# Patient Record
Sex: Female | Born: 1965 | Race: White | Hispanic: No | Marital: Married | Smoking: Never smoker
Health system: Southern US, Community
[De-identification: ages and names within clinical notes are randomized; demographics above are authoritative.]

## PROBLEM LIST (undated history)

## (undated) DIAGNOSIS — Z9189 Other specified personal risk factors, not elsewhere classified: Secondary | ICD-10-CM

## (undated) DIAGNOSIS — K219 Gastro-esophageal reflux disease without esophagitis: Secondary | ICD-10-CM

## (undated) DIAGNOSIS — T7840XA Allergy, unspecified, initial encounter: Secondary | ICD-10-CM

## (undated) DIAGNOSIS — F419 Anxiety disorder, unspecified: Secondary | ICD-10-CM

## (undated) DIAGNOSIS — L80 Vitiligo: Secondary | ICD-10-CM

## (undated) DIAGNOSIS — Z8619 Personal history of other infectious and parasitic diseases: Secondary | ICD-10-CM

## (undated) DIAGNOSIS — R011 Cardiac murmur, unspecified: Secondary | ICD-10-CM

## (undated) DIAGNOSIS — Z87440 Personal history of urinary (tract) infections: Secondary | ICD-10-CM

## (undated) HISTORY — PX: WISDOM TOOTH EXTRACTION: SHX21

## (undated) HISTORY — DX: Gastro-esophageal reflux disease without esophagitis: K21.9

## (undated) HISTORY — DX: Personal history of other infectious and parasitic diseases: Z86.19

## (undated) HISTORY — DX: Allergy, unspecified, initial encounter: T78.40XA

## (undated) HISTORY — DX: Personal history of urinary (tract) infections: Z87.440

## (undated) HISTORY — DX: Cardiac murmur, unspecified: R01.1

## (undated) HISTORY — DX: Other specified personal risk factors, not elsewhere classified: Z91.89

## (undated) HISTORY — DX: Anxiety disorder, unspecified: F41.9

## (undated) HISTORY — DX: Vitiligo: L80

---

## 1999-11-09 HISTORY — PX: TOTAL VAGINAL HYSTERECTOMY: SHX2548

## 1999-11-09 HISTORY — PX: ABDOMINAL HYSTERECTOMY: SHX81

## 2013-12-24 ENCOUNTER — Other Ambulatory Visit: Payer: Self-pay

## 2013-12-24 DIAGNOSIS — Z1231 Encounter for screening mammogram for malignant neoplasm of breast: Secondary | ICD-10-CM

## 2014-01-04 ENCOUNTER — Ambulatory Visit
Admission: RE | Admit: 2014-01-04 | Discharge: 2014-01-04 | Disposition: A | Discharge status: Home / Self Care | Payer: BC Managed Care – PPO | Source: Ambulatory Visit

## 2014-01-04 DIAGNOSIS — Z1231 Encounter for screening mammogram for malignant neoplasm of breast: Secondary | ICD-10-CM

## 2015-03-24 ENCOUNTER — Other Ambulatory Visit: Payer: Self-pay

## 2015-03-24 DIAGNOSIS — Z1231 Encounter for screening mammogram for malignant neoplasm of breast: Secondary | ICD-10-CM

## 2015-04-01 ENCOUNTER — Ambulatory Visit
Admission: RE | Admit: 2015-04-01 | Discharge: 2015-04-01 | Disposition: A | Discharge status: Home / Self Care | Payer: BC Managed Care – PPO | Source: Ambulatory Visit

## 2015-04-01 DIAGNOSIS — Z1231 Encounter for screening mammogram for malignant neoplasm of breast: Secondary | ICD-10-CM

## 2015-04-03 ENCOUNTER — Other Ambulatory Visit (HOSPITAL_COMMUNITY)
Admission: RE | Admit: 2015-04-03 | Discharge: 2015-04-03 | Disposition: A | Discharge status: Home / Self Care | Payer: BC Managed Care – PPO | Source: Ambulatory Visit | Attending: Internal Medicine | Admitting: Internal Medicine

## 2015-04-03 ENCOUNTER — Other Ambulatory Visit: Payer: Self-pay | Admitting: Internal Medicine

## 2015-04-03 DIAGNOSIS — Z01419 Encounter for gynecological examination (general) (routine) without abnormal findings: Secondary | ICD-10-CM | POA: Insufficient documentation

## 2015-04-03 DIAGNOSIS — Z1151 Encounter for screening for human papillomavirus (HPV): Secondary | ICD-10-CM | POA: Diagnosis present

## 2015-04-08 LAB — CYTOLOGY - PAP

## 2016-04-07 ENCOUNTER — Other Ambulatory Visit: Payer: Self-pay

## 2016-04-07 DIAGNOSIS — Z1231 Encounter for screening mammogram for malignant neoplasm of breast: Secondary | ICD-10-CM

## 2016-04-22 ENCOUNTER — Ambulatory Visit
Admission: RE | Admit: 2016-04-22 | Discharge: 2016-04-22 | Disposition: A | Discharge status: Home / Self Care | Payer: BC Managed Care – PPO | Source: Ambulatory Visit

## 2016-04-22 DIAGNOSIS — Z1231 Encounter for screening mammogram for malignant neoplasm of breast: Secondary | ICD-10-CM

## 2017-06-10 ENCOUNTER — Other Ambulatory Visit: Payer: Self-pay | Admitting: Family Medicine

## 2017-06-10 DIAGNOSIS — Z1231 Encounter for screening mammogram for malignant neoplasm of breast: Secondary | ICD-10-CM

## 2017-06-30 ENCOUNTER — Ambulatory Visit
Admission: RE | Admit: 2017-06-30 | Discharge: 2017-06-30 | Disposition: A | Discharge status: Home / Self Care | Payer: BC Managed Care – PPO | Source: Ambulatory Visit | Attending: Family Medicine | Admitting: Family Medicine

## 2017-06-30 DIAGNOSIS — Z1231 Encounter for screening mammogram for malignant neoplasm of breast: Secondary | ICD-10-CM

## 2017-07-01 ENCOUNTER — Other Ambulatory Visit: Payer: Self-pay | Admitting: Family Medicine

## 2017-07-25 ENCOUNTER — Encounter: Payer: Self-pay | Admitting: Family Medicine

## 2017-07-25 ENCOUNTER — Ambulatory Visit (INDEPENDENT_AMBULATORY_CARE_PROVIDER_SITE_OTHER): Payer: BC Managed Care – PPO | Admitting: Family Medicine

## 2017-07-25 VITALS — BP 100/60 | HR 80 | Ht 68.0 in | Wt 143.0 lb

## 2017-07-25 DIAGNOSIS — Z23 Encounter for immunization: Secondary | ICD-10-CM

## 2017-07-25 DIAGNOSIS — Z7689 Persons encountering health services in other specified circumstances: Secondary | ICD-10-CM | POA: Diagnosis not present

## 2017-07-25 DIAGNOSIS — L80 Vitiligo: Secondary | ICD-10-CM | POA: Diagnosis not present

## 2017-07-25 NOTE — Progress Notes (Signed)
Name: Natasha Howell   MRN: [id number]    DOB: 11-18-1965   Date:07/25/2017       Progress Note  Subjective  Chief Complaint  Chief Complaint  Patient presents with  . Establish Care    new to area- get established and sched PE  . Vitiligo    needs referral to derm here    Patient to establish care    No problem-specific Assessment & Plan notes found for this encounter.   Past Medical History:  Diagnosis Date  . Vitiligo     Past Surgical History:  Procedure Laterality Date  . ABDOMINAL HYSTERECTOMY      Family History  Problem Relation Age of Onset  . Hypertension Mother   . Diabetes Sister     Social History   Social History  . Marital status: Married    Spouse name: N/A  . Number of children: N/A  . Years of education: N/A   Occupational History  . Not on file.   Social History Main Topics  . Smoking status: Never Smoker  . Smokeless tobacco: Never Used  . Alcohol use Yes  . Drug use: No  . Sexual activity: Yes   Other Topics Concern  . Not on file   Social History Narrative  . No narrative on file    Allergies  Allergen Reactions  . Morphine And Related     No outpatient prescriptions prior to visit.   No facility-administered medications prior to visit.     Review of Systems  Constitutional: Negative for chills, fever, malaise/fatigue and weight loss.  HENT: Negative for congestion, ear discharge, ear pain, nosebleeds, sore throat and tinnitus.   Eyes: Negative for blurred vision, double vision, photophobia, pain, discharge and redness.  Respiratory: Negative for cough, sputum production, shortness of breath and wheezing.   Cardiovascular: Negative for chest pain, palpitations, orthopnea, claudication, leg swelling and PND.  Gastrointestinal: Positive for heartburn. Negative for abdominal pain, blood in stool, constipation, diarrhea, melena, nausea and vomiting.  Genitourinary: Negative for dysuria, frequency, hematuria and urgency.   Musculoskeletal: Positive for back pain. Negative for joint pain, myalgias and neck pain.  Skin: Negative for rash.       Hx of vitiligo  Neurological: Positive for dizziness. Negative for tingling, tremors, sensory change, speech change, focal weakness, seizures, loss of consciousness and headaches.  Endo/Heme/Allergies: Negative for environmental allergies and polydipsia. Does not bruise/bleed easily.  Psychiatric/Behavioral: Negative for depression and suicidal ideas. The patient is not nervous/anxious and does not have insomnia.      Objective  Vitals:   07/25/17 1433  BP: 100/60  Pulse: 80  Weight: 143 lb (64.9 kg)  Height:  (1.727 m)    Physical Exam  Constitutional: She is well-developed, well-nourished, and in no distress. No distress.  HENT:  Head: Normocephalic and atraumatic.  Right Ear: External ear normal.  Left Ear: External ear normal.  Nose: Nose normal.  Mouth/Throat: Oropharynx is clear and moist.  Eyes: Pupils are equal, round, and reactive to light. Conjunctivae and EOM are normal. Right eye exhibits no discharge. Left eye exhibits no discharge.  Neck: Normal range of motion. Neck supple. No JVD present. No thyromegaly present.  Cardiovascular: Normal rate, regular rhythm, normal heart sounds and intact distal pulses.  Exam reveals no gallop and no friction rub.   No murmur heard. Pulmonary/Chest: Effort normal and breath sounds normal. She has no wheezes. She has no rales.  Abdominal: Soft. Bowel sounds are normal. She exhibits  no mass. There is no tenderness. There is no guarding.  Musculoskeletal: Normal range of motion. She exhibits no edema.  Lymphadenopathy:    She has no cervical adenopathy.  Neurological: She is alert. She has normal reflexes.  Skin: Skin is warm, dry and intact. She is not diaphoretic.  Psychiatric: Mood and affect normal.  Nursing note and vitals reviewed.     Assessment & Plan  Problem List Items Addressed This Visit     None    Visit Diagnoses    Establishing care with new doctor, encounter for    -  Primary   Vitiligo       Relevant Orders   Ambulatory referral to Dermatology   Influenza vaccine needed       Relevant Orders   Flu Vaccine QUAD 36+ mos IM (Completed)      No orders of the defined types were placed in this encounter.     Dr. Hayden [facility]  07/25/17

## 2017-08-25 ENCOUNTER — Encounter: Payer: Self-pay | Admitting: Family Medicine

## 2017-08-25 ENCOUNTER — Ambulatory Visit (INDEPENDENT_AMBULATORY_CARE_PROVIDER_SITE_OTHER): Payer: BC Managed Care – PPO | Admitting: Family Medicine

## 2017-08-25 VITALS — BP 120/80 | HR 68 | Ht 68.0 in | Wt 142.0 lb

## 2017-08-25 DIAGNOSIS — Z1211 Encounter for screening for malignant neoplasm of colon: Secondary | ICD-10-CM | POA: Diagnosis not present

## 2017-08-25 DIAGNOSIS — Z0000 Encounter for general adult medical examination without abnormal findings: Secondary | ICD-10-CM | POA: Diagnosis not present

## 2017-08-25 LAB — HEMOCCULT GUIAC POC 1CARD (OFFICE): Fecal Occult Blood, POC: NEGATIVE

## 2017-08-25 NOTE — Progress Notes (Signed)
Name: Prescilla Monger   MRN: [id number]    DOB: 1966-01-08   Date:08/25/2017       Progress Note  Subjective  Chief Complaint  Chief Complaint  Patient presents with  . Annual Exam    no pap needed, mammo up to date, immun utd, needs rectal exam for colonoscopy    Patient presents for annual physical exam.    No problem-specific Assessment & Plan notes found for this encounter.   Past Medical History:  Diagnosis Date  . Vitiligo     Past Surgical History:  Procedure Laterality Date  . ABDOMINAL HYSTERECTOMY      Family History  Problem Relation Age of Onset  . Hypertension Mother   . Diabetes Sister     Social History   Social History  . Marital status: Married    Spouse name: N/A  . Number of children: N/A  . Years of education: N/A   Occupational History  . Not on file.   Social History Main Topics  . Smoking status: Never Smoker  . Smokeless tobacco: Never Used  . Alcohol use Yes  . Drug use: No  . Sexual activity: Yes   Other Topics Concern  . Not on file   Social History Narrative  . No narrative on file    Allergies  Allergen Reactions  . Morphine And Related     No outpatient prescriptions prior to visit.   No facility-administered medications prior to visit.     Review of Systems  Constitutional: Positive for diaphoresis. Negative for chills, fever, malaise/fatigue and weight loss.  HENT: Negative for congestion, ear discharge, ear pain, hearing loss, nosebleeds, sinus pain, sore throat and tinnitus.   Eyes: Negative for blurred vision, double vision, photophobia, pain, discharge and redness.  Respiratory: Negative for cough, hemoptysis, sputum production, shortness of breath, wheezing and stridor.   Cardiovascular: Negative for chest pain, palpitations, orthopnea, claudication, leg swelling and PND.  Gastrointestinal: Positive for diarrhea, nausea and vomiting. Negative for abdominal pain, blood in stool, constipation, heartburn and  melena.  Genitourinary: Negative for dysuria, flank pain, frequency, hematuria and urgency.  Musculoskeletal: Negative for back pain, falls, joint pain, myalgias and neck pain.  Skin: Negative for itching and rash.  Neurological: Negative for dizziness, tingling, tremors, sensory change, speech change, focal weakness, seizures, loss of consciousness, weakness and headaches.  Endo/Heme/Allergies: Negative for environmental allergies and polydipsia. Does not bruise/bleed easily.  Psychiatric/Behavioral: Negative for depression, hallucinations, memory loss, substance abuse and suicidal ideas. The patient is not nervous/anxious and does not have insomnia.      Objective  Vitals:   08/25/17 0836  BP: 120/80  Pulse: 68  Weight: 142 lb (64.4 kg)  Height: 5\' 8"  (1.727 m)    Physical Exam  Constitutional: She is oriented to person, place, and time and well-developed, well-nourished, and in no distress. No distress.  HENT:  Head: Normocephalic and atraumatic.  Right Ear: External ear and ear canal normal. Tympanic membrane is retracted.  Left Ear: External ear and ear canal normal. Tympanic membrane is retracted.  Nose: Nose normal.  Mouth/Throat: Uvula is midline, oropharynx is clear and moist and mucous membranes are normal.  Eyes: Pupils are equal, round, and reactive to light. Conjunctivae, EOM and lids are normal. Right eye exhibits no discharge. Left eye exhibits no discharge.  Fundoscopic exam:      The right eye shows no arteriolar narrowing.       The left eye shows no arteriolar narrowing.  Neck:  Trachea normal, normal range of motion and full passive range of motion without pain. Neck supple. Normal carotid pulses, no hepatojugular reflux and no JVD present. Carotid bruit is not present. No thyromegaly present.  Cardiovascular: Normal rate, regular rhythm, S1 normal, S2 normal, intact distal pulses and normal pulses.   No extrasystoles are present. PMI is not displaced.  Exam  reveals no gallop, no S3, no S4 and no friction rub.   No murmur heard. Pulmonary/Chest: Effort normal and breath sounds normal. She has no wheezes. She has no rales. She exhibits no mass.  Abdominal: Soft. Normal aorta and bowel sounds are normal. She exhibits no mass. There is no hepatosplenomegaly. There is no tenderness. There is no rebound, no guarding and no CVA tenderness.  Genitourinary: Rectum normal. Rectal exam shows no external hemorrhoid.  Musculoskeletal: Normal range of motion. She exhibits no edema.  Lymphadenopathy:       Head (right side): No submandibular adenopathy present.       Head (left side): No submandibular adenopathy present.    She has no cervical adenopathy.    She has no axillary adenopathy.  Neurological: She is alert and oriented to person, place, and time. She has normal sensation, normal strength, normal reflexes and intact cranial nerves.  Skin: Skin is warm, dry and intact. She is not diaphoretic.  vitiligo  Psychiatric: Mood and affect normal.  Nursing note and vitals reviewed.     Assessment & Plan  Problem List Items Addressed This Visit    None    Visit Diagnoses    Annual physical exam    -  Primary   Relevant Orders   Lipid Profile   Renal Function Panel   Colon cancer screening       Relevant Orders   POCT occult blood stool (Completed)   Ambulatory referral to Gastroenterology      No orders of the defined types were placed in this encounter.     Dr. Hayden Rasmusseneanna Christop Hipp[facility]  08/25/17

## 2017-08-25 NOTE — Patient Instructions (Signed)
Black Cohosh, Cimicifuga racemosa oral dosage forms What is this medicine? BLACK COHOSH (blak KOH hosh) or Cimicifuga racemosa is a dietary supplement. It is promoted to relieve symptoms of menopause, such as hot flashes. The FDA has not approved this supplement for any medical use. This supplement may be used for other purposes; ask your health care provider or pharmacist if you have questions. This medicine may be used for other purposes; ask your health care provider or pharmacist if you have questions. What should I tell my health care provider before I take this medicine? They need to know if you have any of these conditions: -breast cancer -cervical, ovarian or uterine cancer -high blood pressure -infertility -liver disease -menstrual changes or irregular periods -unusual vaginal or uterine bleeding -an unusual or allergic reaction to black cohosh, soybeans, tartrazine dye (yellow dye number 5), other medicines, foods, dyes, or preservatives -pregnant or trying to get pregnant -breast-feeding How should I use this medicine? Take this herb by mouth with a glass of water. Follow the directions on the package labeling, or talk to your health care professional. Do not use for longer than 6 months without the advice of a health care professional. Do not use if you are pregnant or breast-feeding. Talk to your obstetrician-gynecologist or certified nurse-midwife. This herb is not for use in children under the age of 18 years. Overdosage: If you think you have taken too much of this medicine contact a poison control center or emergency room at once. NOTE: This medicine is only for you. Do not share this medicine with others. What if I miss a dose? If you miss a dose, take it as soon as you can. If it is almost time for your next dose, take only that dose. Do not take double or extra doses. What may interact with this medicine? -atorvastatin -cisplatin -fertility treatments This list may not  describe all possible interactions. Give your health care provider a list of all the medicines, herbs, non-prescription drugs, or dietary supplements you use. Also tell them if you smoke, drink alcohol, or use illegal drugs. Some items may interact with your medicine. What should I watch for while using this medicine? Since this herb is derived from a plant, allergic reactions are possible. Stop using this herb if you develop a rash. You may need to see your health care professional, or inform them that this occurred. Report any unusual side effects promptly. If you are taking this herb for menstrual or menopausal symptoms, visit your doctor or health care professional for regular checks on your progress. You should have a complete check-up every 6 months. You will need a regular breast and pelvic exam while on this therapy. Follow the advice of your doctor or health care professional. Women should inform their doctor if they wish to become pregnant or think they might be pregnant. If you have any reason to think you are pregnant, stop taking this herb at once and contact your doctor or health care professional. Herbal or dietary supplements are not regulated like medicines. Rigid quality control standards are not required for dietary supplements. The purity and strength of these products can vary. The safety and effect of this dietary supplement for a certain disease or illness is not well known. This product is not intended to diagnose, treat, cure or prevent any disease. The Food and Drug Administration suggests the following to help consumers protect themselves: -Always read product labels and follow directions. -Natural does not mean a product is   safe for humans to take. -Look for products that include USP after the ingredient name. This means that the manufacturer followed the standards of the [place] Pharmacopoeia. -Supplements made or sold by a nationally known food or drug company are more likely to  be made under tight controls. You can write to the company for more information about how the product was made. What side effects may I notice from receiving this medicine? Side effects that you should report to your doctor or health care professional as soon as possible: -allergic reactions like skin rash, itching or hives, swelling of the face, lips, or tongue -breathing problems -dizziness -palpitations -signs and symptoms of liver injury like dark yellow or brown urine; general ill feeling or flu-like symptoms; light-colored stools; loss of appetite; nausea; right upper belly pain; unusually weak or tired; yellowing of the eyes or skin -unusual vaginal bleeding Side effects that usually do not require medical attention (report to your doctor or health care professional if they continue or are bothersome): -breast tenderness -headache -nausea -upset stomach This list may not describe all possible side effects. Call your doctor for medical advice about side effects. You may report side effects to FDA at 1-800-FDA-1088. Where should I keep my medicine? Keep out of the reach of children. Store at room temperature between 15 and 30 degrees C (59 and 86 degrees C). Throw away any unused herb after the expiration date. NOTE: This sheet is a summary. It may not cover all possible information. If you have questions about this medicine, talk to your doctor, pharmacist, or health care provider.  2018 Elsevier/Gold Standard (2016-05-05 14:35:09)  

## 2017-08-26 LAB — LIPID PANEL
Chol/HDL Ratio: 2.8 ratio (ref 0.0–4.4)
Cholesterol, Total: 146 mg/dL (ref 100–199)
HDL: 52 mg/dL (ref >39)
LDL Calculated: 81 mg/dL (ref 0–99)
TRIGLYCERIDES: 66 mg/dL (ref 0–149)
VLDL CHOLESTEROL CAL: 13 mg/dL (ref 5–40)

## 2017-08-26 LAB — RENAL FUNCTION PANEL
ALBUMIN: 4.3 g/dL (ref 3.5–5.5)
BUN/Creatinine Ratio: 20 (ref 9–23)
BUN: 11 mg/dL (ref 6–24)
CO2: 23 mmol/L (ref 20–29)
CREATININE: 0.55 mg/dL — AB (ref 0.57–1.00)
Calcium: 9.1 mg/dL (ref 8.7–10.2)
Chloride: 103 mmol/L (ref 96–106)
GFR, EST AFRICAN AMERICAN: 126 mL/min/{1.73_m2} (ref >59)
GFR, EST NON AFRICAN AMERICAN: 110 mL/min/{1.73_m2} (ref >59)
GLUCOSE: 73 mg/dL (ref 65–99)
POTASSIUM: 4.3 mmol/L (ref 3.5–5.2)
Phosphorus: 3.5 mg/dL (ref 2.5–4.5)
Sodium: 140 mmol/L (ref 134–144)

## 2017-10-26 ENCOUNTER — Ambulatory Visit: Payer: BC Managed Care – PPO | Admitting: Family Medicine

## 2017-10-26 ENCOUNTER — Encounter: Payer: Self-pay | Admitting: Family Medicine

## 2017-10-26 VITALS — BP 120/70 | HR 64 | Ht 68.0 in | Wt 141.0 lb

## 2017-10-26 DIAGNOSIS — E860 Dehydration: Secondary | ICD-10-CM | POA: Diagnosis not present

## 2017-10-26 DIAGNOSIS — R55 Syncope and collapse: Secondary | ICD-10-CM | POA: Diagnosis not present

## 2017-10-26 NOTE — Progress Notes (Signed)
Name: Natasha Howell   MRN: [id number]    DOB: 07-11-1966   Date:10/26/2017       Progress Note  Subjective  Chief Complaint  Chief Complaint  Patient presents with  . Follow-up    in the ER on 10/21/17 after having chest "discomfort" and nausea/ vomitting. troponin levels were neg x 2- thinks that she is having sugar problems/ sister is type 1 as well as other members of family    Dizziness  This is a new problem. The current episode started in the past 7 days (near syncopy). The problem occurs rarely. Associated symptoms include diaphoresis and nausea. Pertinent negatives include no abdominal pain, anorexia, arthralgias, change in bowel habit, chest pain, chills, congestion, coughing, fatigue, fever, headaches, joint swelling, myalgias, neck pain, numbness, rash, sore throat, swollen glands, urinary symptoms, vertigo, visual change, vomiting or weakness. Nothing aggravates the symptoms. She has tried drinking (iv fluids) for the symptoms. The treatment provided mild relief.    No problem-specific Assessment & Plan notes found for this encounter.   Past Medical History:  Diagnosis Date  . Vitiligo     Past Surgical History:  Procedure Laterality Date  . ABDOMINAL HYSTERECTOMY      Family History  Problem Relation Age of Onset  . Hypertension Mother   . Diabetes Sister     Social History   Socioeconomic History  . Marital status: Married    Spouse name: Not on file  . Number of children: Not on file  . Years of education: Not on file  . Highest education level: Not on file  Social Needs  . Financial resource strain: Not on file  . Food insecurity - worry: Not on file  . Food insecurity - inability: Not on file  . Transportation needs - medical: Not on file  . Transportation needs - non-medical: Not on file  Occupational History  . Not on file  Tobacco Use  . Smoking status: Never Smoker  . Smokeless tobacco: Never Used  Substance and Sexual Activity  . Alcohol  use: Yes  . Drug use: No  . Sexual activity: Yes  Other Topics Concern  . Not on file  Social History Narrative  . Not on file    Allergies  Allergen Reactions  . Morphine And Related     No outpatient medications prior to visit.   No facility-administered medications prior to visit.     Review of Systems  Constitutional: Positive for diaphoresis. Negative for chills, fatigue, fever, malaise/fatigue and weight loss.  HENT: Negative for congestion, ear discharge, ear pain and sore throat.   Eyes: Negative for blurred vision.  Respiratory: Negative for cough, sputum production, shortness of breath and wheezing.   Cardiovascular: Negative for chest pain, palpitations and leg swelling.  Gastrointestinal: Positive for nausea. Negative for abdominal pain, anorexia, blood in stool, change in bowel habit, constipation, diarrhea, heartburn, melena and vomiting.  Genitourinary: Negative for dysuria, frequency, hematuria and urgency.  Musculoskeletal: Negative for arthralgias, back pain, joint pain, joint swelling, myalgias and neck pain.  Skin: Negative for rash.  Neurological: Positive for dizziness. Negative for vertigo, tingling, sensory change, focal weakness, weakness, numbness and headaches.  Endo/Heme/Allergies: Negative for environmental allergies and polydipsia. Does not bruise/bleed easily.  Psychiatric/Behavioral: Negative for depression and suicidal ideas. The patient is not nervous/anxious and does not have insomnia.      Objective  Vitals:   10/26/17 1136  BP: 120/70  Pulse: 64  Weight: 141 lb (64 kg)  Height:  5\' 8"  (1.727 m)    Physical Exam  Constitutional: She is well-developed, well-nourished, and in no distress. No distress.  HENT:  Head: Normocephalic and atraumatic.  Right Ear: External ear normal.  Left Ear: External ear normal.  Nose: Nose normal.  Mouth/Throat: Oropharynx is clear and moist.  Eyes: Conjunctivae and EOM are normal. Pupils are equal,  round, and reactive to light. Right eye exhibits no discharge. Left eye exhibits no discharge.  Neck: Normal range of motion. Neck supple. No JVD present. No thyromegaly present.  Cardiovascular: Normal rate, regular rhythm, normal heart sounds and intact distal pulses. Exam reveals no gallop and no friction rub.  No murmur heard. Pulmonary/Chest: Effort normal and breath sounds normal. She has no wheezes. She has no rales.  Abdominal: Soft. Bowel sounds are normal. She exhibits no mass. There is no tenderness. There is no guarding.  Musculoskeletal: Normal range of motion. She exhibits no edema.  Lymphadenopathy:    She has no cervical adenopathy.  Neurological: She is alert. She has normal reflexes.  Skin: Skin is warm and dry. She is not diaphoretic.  Psychiatric: Mood and affect normal.  Nursing note and vitals reviewed.     Assessment & Plan  Problem List Items Addressed This Visit    None    Visit Diagnoses    Vasovagal near syncope    -  Primary   Dehydration          No orders of the defined types were placed in this encounter.     Dr. Hayden Rasmusseneanna Dorie [facility]ms [facility]  10/26/17

## 2017-10-26 NOTE — Patient Instructions (Signed)
Vasovagal Syncope, Adult  Syncope, which is commonly known as fainting or passing out, is a temporary loss of consciousness. It occurs when the blood flow to the brain is reduced. Vasovagal syncope, also called neurocardiogenic syncope, is a fainting spell that happens when blood flow to the brain is reduced because of a sudden drop in heart rate and blood pressure.  Vasovagal syncope is usually harmless. However, you can get injured if you fall during a fainting spell.  What are the causes?  This condition is caused by a drop in heart rate and blood pressure, usually in response to a trigger. Many things and situations can trigger an episode, including:  · Pain.  · Fear.  · The sight of blood. This may occur during medical procedures, such as when blood is being drawn from a vein.  · Common activities, such as coughing, swallowing, stretching, or going to the bathroom.  · Emotional stress.  · Being in a confined space.  · Prolonged standing, especially in a warm environment.  · Lack of sleep or rest.  · Not eating for a long time.  · Not drinking enough liquids.  · Recent illness.  · Drinking alcohol.  · Taking drugs that affect blood pressure, such as marijuana, cocaine, opiates, or inhalants.    What are the signs or symptoms?  Before a fainting episode, you may:  · Feel dizzy or light-headed.  · Become pale.  · Sense that you are going to faint.  · Feel like the room is spinning.  · Only see directly ahead (tunnel vision).  · Feel sick to your stomach (nauseous).  · See spots.  · Slowly lose vision.  · Hear ringing in your ears.  · Have a headache.  · Feel warm and sweaty.  · Feel a sensation of pins and needles.    During the fainting spell, you may twitch or make jerky movements. Fainting spells usually last no longer than a few minutes before you wake up. If you get up too quickly before your body can recover, you may faint again.  How is this diagnosed?  This condition is diagnosed based on your symptoms,  your medical history, and a physical exam. Tests may be done to rule out other causes of fainting. Tests may include:  · Blood tests.  · Heart tests, such as an electrocardiogram (ECG), echocardiogram, or electrophysiology study.  · A test to check your response to changes in position (tilt table test).    How is this treated?  Usually, treatment is not needed for this condition. Your health care provider may suggest ways to help prevent fainting episodes. These may include:  · Drinking additional fluids if you are exposed to a trigger.  · Sitting or lying down if you notice signs that an episode is coming.    If your fainting spells continue, your health care provider may recommend that you:  · Take medicines to prevent fainting or to help reduce further episodes of fainting.  · Do certain exercises.  · Wear compression stockings.  · Have surgery to place a pacemaker in your body (rare).    Follow these instructions at home:  · Learn to identify the signs that an episode is coming.  · Sit or lie down at the first sign of a fainting spell. If you sit down, put your head down between your legs. If you lie down, swing your legs up in the air to increase blood flow to the brain.  ·   Avoid hot tubs and saunas.  · Avoid standing for a long time. If you have to stand for a long time, try:  ? Crossing your legs.  ? Flexing and stretching your leg muscles.  ? Squatting.  ? Moving your legs.  ? Bending over.  · Drink enough fluid to keep your urine clear or pale yellow.  · Make changes to your diet that your health care provider recommends. You may be told to:  ? Avoid caffeine.  ? Eat more salt.  · Take over-the-counter and prescription medicines only as told by your health care provider.  Contact a health care provider if:  · You continue to have fainting spells despite treatment.  · You faint more often despite treatment.  · You lose consciousness for more than a few minutes.  · You faint during or after exercising or  after being startled.  · You have twitching or jerky movements for longer than a few seconds during a fainting spell.  · You have an episode of twitching or jerky movements without fainting.  Get help right away if:  · A fainting spell leads to an injury or bleeding.  · You have new symptoms that occur with the fainting spells, such as:  ? Shortness of breath.  ? Chest pain.  ? Irregular heartbeat.  · You twitch or make jerky movements for more than 5 minutes.  · You twitch or make jerky movements during more than one fainting spell.  This information is not intended to replace advice given to you by your health care provider. Make sure you discuss any questions you have with your health care provider.  Document Released: 10/11/2012 Document Revised: 04/07/2016 Document Reviewed: 08/23/2015  Elsevier Interactive Patient Education © 2018 [employer]vier Inc[employer]

## 2017-11-09 ENCOUNTER — Telehealth: Payer: Self-pay | Admitting: Gastroenterology

## 2017-11-09 NOTE — Telephone Encounter (Signed)
Patient called back to schedule a procedure.

## 2017-11-09 NOTE — Telephone Encounter (Signed)
Patient called back to schedule colonoscopy.  ?

## 2017-11-10 ENCOUNTER — Other Ambulatory Visit: Payer: Self-pay

## 2017-11-10 DIAGNOSIS — Z1211 Encounter for screening for malignant neoplasm of colon: Secondary | ICD-10-CM

## 2017-11-10 NOTE — Progress Notes (Signed)
Gastroenterology Pre-Procedure Review  Request Date: 2/8 Requesting Physician: Dr. Servando SnareWohl  PATIENT REVIEW QUESTIONS: The patient responded to the following health history questions as indicated:    1. Are you having any GI issues? no 2. Do you have a personal history of Polyps? no 3. Do you have a family history of Colon Cancer or Polyps? no 4. Diabetes Mellitus? no 5. Joint replacements in the past 12 months?no 6. Major health problems in the past 3 months?no 7. Any artificial heart valves, MVP, or defibrillator?no    MEDICATIONS & ALLERGIES:    Patient reports the following regarding taking any anticoagulation/antiplatelet therapy:   Plavix, Coumadin, Eliquis, Xarelto, Lovenox, Pradaxa, Brilinta, or Effient? no Aspirin? no  Patient confirms/reports the following medications:  No current outpatient medications on file.   No current facility-administered medications for this visit.     Patient confirms/reports the following allergies:  Allergies  Allergen Reactions  . Morphine And Related     No orders of the defined types were placed in this encounter.   AUTHORIZATION INFORMATION Primary Insurance: 1D#: Group #:  Secondary Insurance: 1D#: Group #:  SCHEDULE INFORMATION: Date: 2/8 Time: Location: [facility]

## 2017-11-10 NOTE — Telephone Encounter (Signed)
Returned patient's call to schedule colonoscopy.

## 2017-11-21 ENCOUNTER — Ambulatory Visit: Payer: BC Managed Care – PPO | Admitting: Family Medicine

## 2017-11-21 ENCOUNTER — Encounter: Payer: Self-pay | Admitting: Family Medicine

## 2017-11-21 VITALS — BP 120/80 | HR 72 | Ht 68.0 in | Wt 142.0 lb

## 2017-11-21 DIAGNOSIS — F419 Anxiety disorder, unspecified: Secondary | ICD-10-CM | POA: Diagnosis not present

## 2017-11-21 DIAGNOSIS — R002 Palpitations: Secondary | ICD-10-CM

## 2017-11-21 NOTE — Progress Notes (Signed)
Name: Natasha Howell   MRN: [id number]    DOB: 1966/06/29   Date:11/21/2017       Progress Note  Subjective  Chief Complaint  Chief Complaint  Patient presents with  . change in bowel habits    no appetite, diarrhea off and on- sched for colonoscopy on 12/16/17  . Anxiety    notices that it starts prior to traveling and during traveling. Has vomitting and diarrhea. Has to travel with job    Anxiety  Presents for follow-up visit. Symptoms include dizziness, excessive worry, nervous/anxious behavior and palpitations. Patient reports no chest pain, compulsions, confusion, decreased concentration, depressed mood, dry mouth, feeling of choking, hyperventilation, impotence, insomnia, irritability, malaise, muscle tension, nausea, obsessions, panic, restlessness, shortness of breath or suicidal ideas. Symptoms occur most days. The severity of symptoms is moderate. The quality of sleep is good. Nighttime awakenings: occasional.   Side effects of treatment include GI discomfort.    No problem-specific Assessment & Plan notes found for this encounter.   Past Medical History:  Diagnosis Date  . Vitiligo     Past Surgical History:  Procedure Laterality Date  . ABDOMINAL HYSTERECTOMY      Family History  Problem Relation Age of Onset  . Hypertension Mother   . Diabetes Sister     Social History   Socioeconomic History  . Marital status: Married    Spouse name: Not on file  . Number of children: Not on file  . Years of education: Not on file  . Highest education level: Not on file  Social Needs  . Financial resource strain: Not on file  . Food insecurity - worry: Not on file  . Food insecurity - inability: Not on file  . Transportation needs - medical: Not on file  . Transportation needs - non-medical: Not on file  Occupational History  . Not on file  Tobacco Use  . Smoking status: Never Smoker  . Smokeless tobacco: Never Used  Substance and Sexual Activity  . Alcohol use: Yes   . Drug use: No  . Sexual activity: Yes  Other Topics Concern  . Not on file  Social History Narrative  . Not on file    Allergies  Allergen Reactions  . Morphine And Related     Outpatient Medications Prior to Visit  Medication Sig Dispense Refill  . omeprazole (PRILOSEC) 20 MG capsule Take 20 mg by mouth daily. otc    . Probiotic Product (ALIGN EXTRA STRENGTH PO) Take 1 capsule by mouth daily.     No facility-administered medications prior to visit.     Review of Systems  Constitutional: Negative for chills, fever, irritability, malaise/fatigue and weight loss.  HENT: Negative for ear discharge, ear pain and sore throat.   Eyes: Negative for blurred vision.  Respiratory: Negative for cough, sputum production, shortness of breath and wheezing.   Cardiovascular: Positive for palpitations. Negative for chest pain and leg swelling.  Gastrointestinal: Negative for abdominal pain, blood in stool, constipation, diarrhea, heartburn, melena and nausea.  Genitourinary: Negative for dysuria, frequency, hematuria, impotence and urgency.  Musculoskeletal: Negative for back pain, joint pain, myalgias and neck pain.  Skin: Negative for rash.  Neurological: Positive for dizziness. Negative for tingling, sensory change, focal weakness and headaches.  Endo/Heme/Allergies: Negative for environmental allergies and polydipsia. Does not bruise/bleed easily.  Psychiatric/Behavioral: Negative for confusion, decreased concentration, depression and suicidal ideas. The patient is nervous/anxious. The patient does not have insomnia.      Objective  Vitals:  11/21/17 1339  BP: 120/80  Pulse: 72  Weight: 142 lb (64.4 kg)  Height: 5\' 8"  (1.727 m)    Physical Exam  Constitutional: She is well-developed, well-nourished, and in no distress. No distress.  HENT:  Head: Normocephalic and atraumatic.  Right Ear: External ear normal.  Left Ear: External ear normal.  Nose: Nose normal.   Mouth/Throat: Oropharynx is clear and moist.  Eyes: Conjunctivae and EOM are normal. Pupils are equal, round, and reactive to light. Right eye exhibits no discharge. Left eye exhibits no discharge.  Neck: Normal range of motion. Neck supple. No JVD present. No thyromegaly present.  Cardiovascular: Normal rate, regular rhythm, normal heart sounds and intact distal pulses. Exam reveals no gallop and no friction rub.  No murmur heard. Pulmonary/Chest: Effort normal and breath sounds normal. She has no wheezes. She has no rales.  Abdominal: Soft. Bowel sounds are normal. She exhibits no mass. There is no tenderness. There is no guarding.  Musculoskeletal: Normal range of motion. She exhibits no edema.  Lymphadenopathy:    She has no cervical adenopathy.  Neurological: She is alert. She has normal reflexes.  Skin: Skin is warm and dry. She is not diaphoretic.  Psychiatric: Mood and affect normal.  Nursing note and vitals reviewed.     Assessment & Plan  Problem List Items Addressed This Visit    None    Visit Diagnoses    Palpitations    -  Primary   eval neaer syncopes/?Holter/echo   Relevant Orders   EKG 12-Lead (Completed)   CBC with Differential/Platelet   Renal function panel   TSH   Ambulatory referral to Cardiology   Anxiety          No orders of the defined types were placed in this encounter.     Dr. Hayden Rasmusseneanna Jones [facility]  11/21/17

## 2017-11-22 LAB — CBC WITH DIFFERENTIAL/PLATELET
BASOS: 0 %
Basophils Absolute: 0 10*3/uL (ref 0.0–0.2)
EOS (ABSOLUTE): 0.2 10*3/uL (ref 0.0–0.4)
EOS: 2 %
HEMATOCRIT: 43 % (ref 34.0–46.6)
HEMOGLOBIN: 14.1 g/dL (ref 11.1–15.9)
Immature Grans (Abs): 0 10*3/uL (ref 0.0–0.1)
Immature Granulocytes: 0 %
LYMPHS ABS: 1.9 10*3/uL (ref 0.7–3.1)
Lymphs: 23 %
MCH: 30 pg (ref 26.6–33.0)
MCHC: 32.8 g/dL (ref 31.5–35.7)
MCV: 92 fL (ref 79–97)
MONOS ABS: 0.6 10*3/uL (ref 0.1–0.9)
Monocytes: 7 %
NEUTROS ABS: 5.7 10*3/uL (ref 1.4–7.0)
Neutrophils: 68 %
Platelets: 239 10*3/uL (ref 150–379)
RBC: 4.7 x10E6/uL (ref 3.77–5.28)
RDW: 13.2 % (ref 12.3–15.4)
WBC: 8.3 10*3/uL (ref 3.4–10.8)

## 2017-11-22 LAB — RENAL FUNCTION PANEL
ALBUMIN: 4.8 g/dL (ref 3.5–5.5)
BUN / CREAT RATIO: 14 (ref 9–23)
BUN: 11 mg/dL (ref 6–24)
CO2: 23 mmol/L (ref 20–29)
CREATININE: 0.79 mg/dL (ref 0.57–1.00)
Calcium: 9.5 mg/dL (ref 8.7–10.2)
Chloride: 98 mmol/L (ref 96–106)
GFR, EST AFRICAN AMERICAN: 100 mL/min/{1.73_m2} (ref >59)
GFR, EST NON AFRICAN AMERICAN: 87 mL/min/{1.73_m2} (ref >59)
Glucose: 86 mg/dL (ref 65–99)
Phosphorus: 4.6 mg/dL — ABNORMAL HIGH (ref 2.5–4.5)
Potassium: 4.4 mmol/L (ref 3.5–5.2)
Sodium: 139 mmol/L (ref 134–144)

## 2017-11-22 LAB — TSH: TSH: 1.72 u[IU]/mL (ref 0.450–4.500)

## 2017-11-24 ENCOUNTER — Other Ambulatory Visit: Payer: Self-pay

## 2017-11-24 DIAGNOSIS — F419 Anxiety disorder, unspecified: Secondary | ICD-10-CM

## 2017-11-24 MED ORDER — BUSPIRONE HCL 15 MG PO TABS: 15.0000 mg | ORAL_TABLET | Freq: Every day | ORAL | 0 refills | Status: DC

## 2017-11-25 DIAGNOSIS — R002 Palpitations: Secondary | ICD-10-CM | POA: Insufficient documentation

## 2017-11-25 DIAGNOSIS — R55 Syncope and collapse: Secondary | ICD-10-CM | POA: Insufficient documentation

## 2017-11-30 ENCOUNTER — Other Ambulatory Visit: Payer: Self-pay

## 2017-11-30 DIAGNOSIS — F419 Anxiety disorder, unspecified: Secondary | ICD-10-CM

## 2017-11-30 MED ORDER — ALPRAZOLAM 0.25 MG PO TABS: 0.2500 mg | ORAL_TABLET | Freq: Every day | ORAL | 0 refills | Status: DC | PRN

## 2017-12-14 ENCOUNTER — Telehealth: Payer: Self-pay | Admitting: Gastroenterology

## 2017-12-14 NOTE — Telephone Encounter (Signed)
Patient called to cancel her procedure due to traveling the next 6 weeks with work. She will call us back to reschedule. I called [facility]

## 2017-12-23 ENCOUNTER — Ambulatory Visit: Admit: 2017-12-23 | Payer: BC Managed Care – PPO | Admitting: Gastroenterology

## 2017-12-23 SURGERY — COLONOSCOPY WITH PROPOFOL
Anesthesia: Choice

## 2018-01-20 ENCOUNTER — Ambulatory Visit: Payer: BC Managed Care – PPO | Admitting: Psychology

## 2018-01-20 DIAGNOSIS — F4322 Adjustment disorder with anxiety: Secondary | ICD-10-CM

## 2018-02-03 ENCOUNTER — Ambulatory Visit: Payer: BC Managed Care – PPO | Admitting: Psychology

## 2018-02-03 DIAGNOSIS — F4322 Adjustment disorder with anxiety: Secondary | ICD-10-CM

## 2018-02-17 ENCOUNTER — Ambulatory Visit: Payer: BC Managed Care – PPO | Admitting: Psychology

## 2018-02-17 DIAGNOSIS — F4322 Adjustment disorder with anxiety: Secondary | ICD-10-CM | POA: Diagnosis not present

## 2018-03-24 ENCOUNTER — Ambulatory Visit: Payer: BC Managed Care – PPO | Admitting: Psychology

## 2018-03-24 DIAGNOSIS — F4322 Adjustment disorder with anxiety: Secondary | ICD-10-CM | POA: Diagnosis not present

## 2018-05-01 ENCOUNTER — Ambulatory Visit
Admission: EM | Admit: 2018-05-01 | Discharge: 2018-05-01 | Disposition: A | Discharge status: Home / Self Care | Payer: BC Managed Care – PPO | Attending: Emergency Medicine | Admitting: Emergency Medicine

## 2018-05-01 ENCOUNTER — Encounter: Payer: Self-pay | Admitting: *Deleted

## 2018-05-01 DIAGNOSIS — R05 Cough: Secondary | ICD-10-CM | POA: Diagnosis not present

## 2018-05-01 DIAGNOSIS — R059 Cough, unspecified: Secondary | ICD-10-CM

## 2018-05-01 MED ORDER — BENZONATATE 200 MG PO CAPS: 200.0000 mg | ORAL_CAPSULE | Freq: Three times a day (TID) | ORAL | 0 refills | Status: DC | PRN

## 2018-05-01 MED ORDER — AZITHROMYCIN 250 MG PO TABS: 250.0000 mg | ORAL_TABLET | Freq: Every day | ORAL | 0 refills | Status: DC

## 2018-05-01 MED ORDER — HYDROCOD POLST-CPM POLST ER 10-8 MG/5ML PO SUER: 5.0000 mL | Freq: Two times a day (BID) | ORAL | 0 refills | Status: DC | PRN

## 2018-05-01 NOTE — ED Provider Notes (Signed)
HPI  SUBJECTIVE:  Natasha Howell is a 52 y.o. female who presents with a cough productive of greenish mucus for the past week.  States that she is getting worse.  States that she is unable to sleep at night secondary to the cough.  Reports chest soreness from the cough.  She states that she had postnasal drip, but this has resolved.  This started off as a sore throat.  No fevers, wheezing, shortness of breath, dyspnea on exertion.  No nasal congestion, rhinorrhea, sinus pain or pressure, dental pain.  She denies allergies or GERD symptoms.  She tried Robitussin, Mucinex and hot Toddy's.  The hot Toddy's help.  Symptoms are worse with Robitussin, talking.  No antibiotics in the past month.  No antipyretic in the past 6 to 8 hours.  She has a past medical history of GERD and states that it is not bothering her.  No history of lung disease, smoking, diabetes, hypertension.  PMD: Dr. Yetta BarreJones.  She states that she is in the process of looking for another provider.    Past Medical History:  Diagnosis Date  . Vitiligo     Past Surgical History:  Procedure Laterality Date  . ABDOMINAL HYSTERECTOMY      Family History  Problem Relation Age of Onset  . Hypertension Mother   . Diabetes Sister     Social History   Tobacco Use  . Smoking status: Never Smoker  . Smokeless tobacco: Never Used  Substance Use Topics  . Alcohol use: Yes  . Drug use: No    No current facility-administered medications for this encounter.   Current Outpatient Medications:  .  ALPRAZolam (XANAX) 0.25 MG tablet, Take 1 tablet (0.25 mg total) by mouth daily as needed for anxiety., Disp: 5 tablet, Rfl: 0 .  azithromycin (ZITHROMAX) 250 MG tablet, Take 1 tablet (250 mg total) by mouth daily. 2 tabs po on day 1, 1 tab po on days 2-5, Disp: 6 tablet, Rfl: 0 .  benzonatate (TESSALON) 200 MG capsule, Take 1 capsule (200 mg total) by mouth 3 (three) times daily as needed for cough., Disp: 30 capsule, Rfl: 0 .   chlorpheniramine-HYDROcodone (TUSSIONEX PENNKINETIC ER) 10-8 MG/5ML SUER, Take 5 mLs by mouth every 12 (twelve) hours as needed for cough., Disp: 120 mL, Rfl: 0  Allergies  Allergen Reactions  . Morphine And Related      ROS  As noted in HPI.   Physical Exam  BP 115/69 (BP Location: Left Arm)   Pulse 65   Temp 98.1 F (36.7 C) (Oral)   Resp 16   Ht 5\' 8"  (1.727 m)   Wt 137 lb (62.1 kg)   SpO2 98%   BMI 20.83 kg/m   Constitutional: Well developed, well nourished, no acute distress Eyes:  EOMI, conjunctiva normal bilaterally HENT: Normocephalic, atraumatic,mucus membranes moist nasal congestion.  Normal turbinates.  No maxillary sinus tenderness.  No cobblestoning or postnasal drip. Respiratory: Normal inspiratory effort is clear bilaterally, good air movement.  Positive lateral chest wall tenderness Cardiovascular: Normal rate, regular rhythm, no murmurs, rubs, gallops GI: nondistended skin: No rash, skin intact Musculoskeletal: no deformities Neurologic: Alert & oriented x 3, no focal neuro deficits Psychiatric: Speech and behavior appropriate   ED Course   Medications - No data to display  No orders of the defined types were placed in this encounter.   No results found for this or any previous visit (from the past 24 hour(s)). No results found.  ED Clinical  Impression  Cough   ED Assessment/Plan  [place] Narcotic database reviewed for this patient, and feel that the risk/benefit ratio today is favorable for proceeding with a prescription for controlled substance.  No opiate prescriptions in the past 2 years.  Suspect post viral cough syndrome, no evidence of sinusitis.  There is no wheezing and she has no history of pulmonary disease, so do not think that bronchodilators would be helpful.  Doubt GERD.  Doubt other serious cause of her symptoms.  Will send home with Tessalon, Tussionex, continue Mucinex, push fluids.  Will send home with a wait-and-see prescription  of azithromycin if she is not better in 3 days, if she starts having a fever, or if she gets worse.  Follow-up with her primary care physician as needed.  Patient denies anaphylaxis to morphine and states that she is willing to try the Tussionex.  Says she has had hydrocodone before without any problem.  Imaging deferred in the absence of focal lung findings, history of fever.  Doubt Pneumonia.  Discussed MDM, treatment plan, and plan for follow-up with patient.patient agrees with plan.   Meds ordered this encounter  Medications  . benzonatate (TESSALON) 200 MG capsule    Sig: Take 1 capsule (200 mg total) by mouth 3 (three) times daily as needed for cough.    Dispense:  30 capsule    Refill:  0  . chlorpheniramine-HYDROcodone (TUSSIONEX PENNKINETIC ER) 10-8 MG/5ML SUER    Sig: Take 5 mLs by mouth every 12 (twelve) hours as needed for cough.    Dispense:  120 mL    Refill:  0  . azithromycin (ZITHROMAX) 250 MG tablet    Sig: Take 1 tablet (250 mg total) by mouth daily. 2 tabs po on day 1, 1 tab po on days 2-5    Dispense:  6 tablet    Refill:  0    *This clinic note was created using Scientist, clinical (histocompatibility and immunogenetics). Therefore, there may be occasional mistakes despite careful proofreading.   ?   Domenick Gong, MD 05/03/18 [phone]

## 2018-05-01 NOTE — Discharge Instructions (Addendum)
Tessalon for the cough during the day, Tussionex at night.  Do not take the Xanax if you are taking the Tussionex as they can have a additive effect and slow your breathing down to dangerous levels.  Continue Mucinex, push fluids.  I would wait several days to fill the azithromycin, however if not better in 3 days, or if you start to have a fever above 100.4, and go ahead and fill it.  Here is a list of primary care providers who are taking new patients:  Dr. Elizabeth Sauereanna Jones, Dr. Schuyler AmorWilliam Plonk [address] [place] [address] [phone]  [facility] [place] [address]  [place] [address]  [phone]  [facility] [address]  [place], [place] [address] [phone]  [facility] [place] [address]  [phone] [place], [place] [address]  Here are clinics/ other resources who will see you if you do not have insurance. Some have certain criteria that you must meet. Call them and find out what they are:  [facility]: [address] [place] [address] [place], [place] [address] Phone: [phone] Hours: First and Third Saturdays of each Month, 9 a.m. - 1 p.m.  [facility]: [address], [place], [place] [address] Phone: [phone] Hours: Tuesday, 4 p.m. - 8 p.m. Thursday, 1 p.m. - 8 p.m. Wednesday, 9 a.m. - [facility]  [facility] [address], [place], [place] [address] Phone: [phone] Pharmacy Phone Number: [phone] Dental Phone Number: [phone] [facility] Insurance Help: [phone]  Dental Hours: Monday - Thursday, 8 a.m. - 6 p.m.  [facility] [address] [place], [place] [address] Phone: [phone] Pharmacy Phone Number: [phone] [facility] Insurance Help: [phone]  [facility] [address] [place], [place] [address] Phone: [phone] Pharmacy Phone Number: [phone] [facility] Insurance Help: [phone]  [facility] [address] [place], [place] [address] Phone: [phone] [facility] Insurance Help: [phone]   [facility] [address] [place], [place] [address] Phone: [phone]  Go to www.goodrx.com to look up your medications. This will give you a list of where you can find your prescriptions at the most affordable prices. Or ask the pharmacist what the cash price is, or if they have any other discount programs available to help make your medication more affordable. This can be less expensive than what you would pay with insurance.

## 2018-05-01 NOTE — ED Triage Notes (Signed)
Productive cough- green x1 week. Also, mild sore throat that is "getting better".

## 2018-07-25 ENCOUNTER — Ambulatory Visit: Payer: BC Managed Care – PPO | Admitting: Psychology

## 2018-07-25 DIAGNOSIS — F4322 Adjustment disorder with anxiety: Secondary | ICD-10-CM

## 2018-08-08 ENCOUNTER — Other Ambulatory Visit: Payer: Self-pay | Admitting: Family Medicine

## 2018-08-08 DIAGNOSIS — Z1231 Encounter for screening mammogram for malignant neoplasm of breast: Secondary | ICD-10-CM

## 2018-08-10 ENCOUNTER — Ambulatory Visit
Admission: RE | Admit: 2018-08-10 | Discharge: 2018-08-10 | Disposition: A | Discharge status: Home / Self Care | Payer: BC Managed Care – PPO | Source: Ambulatory Visit | Attending: Family Medicine | Admitting: Family Medicine

## 2018-08-10 DIAGNOSIS — Z1231 Encounter for screening mammogram for malignant neoplasm of breast: Secondary | ICD-10-CM

## 2018-08-23 ENCOUNTER — Ambulatory Visit: Payer: BC Managed Care – PPO | Admitting: Family Medicine

## 2018-08-25 ENCOUNTER — Encounter: Payer: Self-pay | Admitting: Family Medicine

## 2018-08-25 ENCOUNTER — Ambulatory Visit (INDEPENDENT_AMBULATORY_CARE_PROVIDER_SITE_OTHER): Payer: BC Managed Care – PPO | Admitting: Family Medicine

## 2018-08-25 VITALS — BP 100/66 | HR 62 | Temp 97.7°F | Ht 68.0 in | Wt 143.6 lb

## 2018-08-25 DIAGNOSIS — Z114 Encounter for screening for human immunodeficiency virus [HIV]: Secondary | ICD-10-CM | POA: Diagnosis not present

## 2018-08-25 DIAGNOSIS — F418 Other specified anxiety disorders: Secondary | ICD-10-CM | POA: Diagnosis not present

## 2018-08-25 DIAGNOSIS — Z23 Encounter for immunization: Secondary | ICD-10-CM | POA: Diagnosis not present

## 2018-08-25 DIAGNOSIS — Z0000 Encounter for general adult medical examination without abnormal findings: Secondary | ICD-10-CM | POA: Diagnosis not present

## 2018-08-25 DIAGNOSIS — L80 Vitiligo: Secondary | ICD-10-CM | POA: Insufficient documentation

## 2018-08-25 DIAGNOSIS — Z1211 Encounter for screening for malignant neoplasm of colon: Secondary | ICD-10-CM | POA: Diagnosis not present

## 2018-08-25 LAB — CBC WITH DIFFERENTIAL/PLATELET
BASOS PCT: 0.8 % (ref 0.0–3.0)
Basophils Absolute: 0 10*3/uL (ref 0.0–0.1)
EOS PCT: 2 % (ref 0.0–5.0)
Eosinophils Absolute: 0.1 10*3/uL (ref 0.0–0.7)
HEMATOCRIT: 40.5 % (ref 36.0–46.0)
HEMOGLOBIN: 13.7 g/dL (ref 12.0–15.0)
Lymphocytes Relative: 30.6 % (ref 12.0–46.0)
Lymphs Abs: 1.5 10*3/uL (ref 0.7–4.0)
MCHC: 33.8 g/dL (ref 30.0–36.0)
MCV: 90.4 fl (ref 78.0–100.0)
MONOS PCT: 9 % (ref 3.0–12.0)
Monocytes Absolute: 0.4 10*3/uL (ref 0.1–1.0)
Neutro Abs: 2.7 10*3/uL (ref 1.4–7.7)
Neutrophils Relative %: 57.6 % (ref 43.0–77.0)
Platelets: 197 10*3/uL (ref 150.0–400.0)
RBC: 4.47 Mil/uL (ref 3.87–5.11)
RDW: 13.7 % (ref 11.5–15.5)
WBC: 4.7 10*3/uL (ref 4.0–10.5)

## 2018-08-25 LAB — COMPREHENSIVE METABOLIC PANEL
ALBUMIN: 4.5 g/dL (ref 3.5–5.2)
ALK PHOS: 42 U/L (ref 39–117)
ALT: 16 U/L (ref 0–35)
AST: 20 U/L (ref 0–37)
BUN: 13 mg/dL (ref 6–23)
CALCIUM: 9.6 mg/dL (ref 8.4–10.5)
CO2: 29 mEq/L (ref 19–32)
Chloride: 103 mEq/L (ref 96–112)
Creatinine, Ser: 0.63 mg/dL (ref 0.40–1.20)
GFR: 105.54 mL/min (ref >60.00)
Glucose, Bld: 73 mg/dL (ref 70–99)
POTASSIUM: 3.8 meq/L (ref 3.5–5.1)
Sodium: 141 mEq/L (ref 135–145)
TOTAL PROTEIN: 7.1 g/dL (ref 6.0–8.3)
Total Bilirubin: 0.5 mg/dL (ref 0.2–1.2)

## 2018-08-25 LAB — TSH: TSH: 1.74 u[IU]/mL (ref 0.35–4.50)

## 2018-08-25 LAB — LIPID PANEL
CHOL/HDL RATIO: 2
CHOLESTEROL: 149 mg/dL (ref 0–200)
HDL: 59.5 mg/dL (ref >39.00)
LDL CALC: 80 mg/dL (ref 0–99)
NonHDL: 89.16
TRIGLYCERIDES: 46 mg/dL (ref 0.0–149.0)
VLDL: 9.2 mg/dL (ref 0.0–40.0)

## 2018-08-25 LAB — VITAMIN D 25 HYDROXY (VIT D DEFICIENCY, FRACTURES): VITD: 37.8 ng/mL (ref 30.00–100.00)

## 2018-08-25 NOTE — Progress Notes (Signed)
Patient: Natasha Howell MRN: [id number] DOB: 1966/11/08 PCP: Natasha Mustard, MD     Subjective:  Chief Complaint  Patient presents with  . Establish Care    HPI: The patient is a 52 y.o. female who presents today for annual exam. She denies any changes to past medical history. There have been no recent hospitalizations. They are following a well balanced diet and exercise plan. Weight has been stable. No complaints today.   Situational anxiety: she is in counseling with Natasha Howell. She has klonopin, but has not needed this since June. Only due to travel with her work.   Immunization History  Administered Date(s) Administered  . Hepatitis B 05/29/2015, 06/30/2015  . Influenza,inj,Quad PF,6+ Mos 07/25/2017, 08/25/2018  . Tdap 12/12/2013   Colonoscopy: needs to reschedule. Will order today.  Mammogram: 08/2018 Pap smear: still has a cervix, but had hysterectomy. Will need pap smears.  Flu: today   Review of Systems  Constitutional: Positive for fatigue. Negative for chills and fever.  HENT: Negative for dental problem, ear pain, hearing loss and trouble swallowing.   Eyes: Negative for visual disturbance.  Respiratory: Negative for cough, chest tightness and shortness of breath.   Cardiovascular: Negative for chest pain, palpitations and leg swelling.  Gastrointestinal: Negative for abdominal pain, blood in stool, diarrhea and nausea.  Endocrine: Negative for cold intolerance, polydipsia, polyphagia and polyuria.  Genitourinary: Negative for dysuria, hematuria, vaginal discharge and vaginal pain.  Musculoskeletal: Positive for back pain. Negative for arthralgias and neck pain.       C/o chronic lower back pain  Skin: Negative for rash.  Neurological: Negative for dizziness and headaches.  Psychiatric/Behavioral: Positive for sleep disturbance. Negative for dysphoric mood. The patient is not nervous/anxious.     Allergies Patient is allergic to morphine and related.  Past  Medical History Patient  has a past medical history of Allergy, Anxiety, GERD (gastroesophageal reflux disease), Heart murmur, History of chicken pox, History of fainting spells of unknown cause, History of recurrent UTIs, and Vitiligo.  Surgical History Patient  has a past surgical history that includes Abdominal hysterectomy (2001).  Family History Pateint's family history includes Anxiety disorder in her brother; Arthritis in her mother; Depression in her brother and mother; Diabetes in her sister; Hyperlipidemia in her mother; Hypertension in her mother; Mental illness in her sister.  Social History Patient  reports that she has never smoked. She has never used smokeless tobacco. She reports that she drinks alcohol. She reports that she does not use drugs.    Objective: Vitals:   08/25/18 0818  BP: 100/66  Pulse: 62  Temp: 97.7 F (36.5 C)  TempSrc: Oral  SpO2: 99%  Weight: 143 lb 9.6 oz (65.1 kg)  Height: 5\' 8"  (1.727 m)    Body mass index is 21.83 kg/m.  Physical Exam  Constitutional: She is oriented to person, place, and time. She appears well-developed and well-nourished.  HENT:  Right Ear: External ear normal.  Left Ear: External ear normal.  Mouth/Throat: Oropharynx is clear and moist.  Eyes: Pupils are equal, round, and reactive to light. Conjunctivae and EOM are normal.  Neck: Normal range of motion. Neck supple. No thyromegaly present.  Cardiovascular: Normal rate, regular rhythm, normal heart sounds and intact distal pulses.  No murmur heard. Pulmonary/Chest: Effort normal and breath sounds normal.  Abdominal: Soft. Bowel sounds are normal. She exhibits no distension. There is no tenderness.  Lymphadenopathy:    She has no cervical adenopathy.  Neurological: She is alert  and oriented to person, place, and time. She displays normal reflexes. No cranial nerve deficit. Coordination normal.  Skin: Skin is warm and dry. No rash noted.  Vitiligo over neck, chest,  arms, legs   Psychiatric: She has a normal mood and affect. Her behavior is normal.  Vitals reviewed.      Depression screen [facility] 2/9 08/25/2018 07/25/2017  Decreased Interest 0 0  Down, Depressed, Hopeless 0 0  PHQ - 2 Score 0 0  Altered sleeping - 0  Tired, decreased energy - 1  Change in appetite - 0  Feeling bad or failure about yourself  - 0  Trouble concentrating - 0  Moving slowly or fidgety/restless - 0  Suicidal thoughts - 0  PHQ-9 Score - 1  Difficult doing work/chores - Not difficult at all   GAD 7 : Generalized Anxiety Score 08/25/2018 08/25/2018  Nervous, Anxious, on Edge 0 0  Control/stop worrying 0 0  Worry too much - different things 0 0  Trouble relaxing 0 0  Restless 0 0  Easily annoyed or irritable 0 0  Afraid - awful might happen 0 0  Total GAD 7 Score 0 0  Anxiety Difficulty Not difficult at all -     Assessment/plan: 1. Annual physical exam Routine lab work. Flu shot. Will come back for pap smear. Colonoscopy referral done. continue healthy diet/exericse. Followed by derm for skin. F/u in one year or sooner for pap Patient counseling [x]    Nutrition: Stressed importance of moderation in sodium/caffeine intake, saturated fat and cholesterol, caloric balance, sufficient intake of fresh fruits, vegetables, fiber, calcium, iron, and 1 mg of folate supplement per day (for females capable of pregnancy).  [x]    Stressed the importance of regular exercise.   [x]    Substance Abuse: Discussed cessation/primary prevention of tobacco, alcohol, or other drug use; driving or other dangerous activities under the influence; availability of treatment for abuse.   [x]    Injury prevention: Discussed safety belts, safety helmets, smoke detector, smoking near bedding or upholstery.   [x]    Sexuality: Discussed sexually transmitted diseases, partner selection, use of condoms, avoidance of unintended pregnancy  and contraceptive alternatives.  [x]    Dental health: Discussed  importance of regular tooth brushing, flossing, and dental visits.  [x]    Health maintenance and immunizations reviewed. Please refer to Health maintenance section.  . - CBC with Differential/Platelet - Comprehensive metabolic panel - Lipid panel - TSH - VITAMIN D 25 Hydroxy (Vit-D Deficiency, Fractures)  2. Situational anxiety Well controlled with counseling. Rarely needs klonopin. No refills needed today.   3. Screen for colon cancer  - Ambulatory referral to Gastroenterology  4. Encounter for screening for HIV  - HIV Antibody (routine testing w rflx)  5. Flu vaccine today   Return in about 1 year (around 08/26/2019) for or sooner for pap smear. Natasha Mustard, MD [place] [facility]  08/25/2018

## 2018-08-26 LAB — HIV ANTIBODY (ROUTINE TESTING W REFLEX): HIV 1&2 Ab, 4th Generation: NONREACTIVE

## 2018-09-25 ENCOUNTER — Encounter: Payer: Self-pay | Admitting: Family Medicine

## 2018-10-16 ENCOUNTER — Ambulatory Visit: Payer: BC Managed Care – PPO | Admitting: Psychology

## 2018-10-16 DIAGNOSIS — F4322 Adjustment disorder with anxiety: Secondary | ICD-10-CM | POA: Diagnosis not present

## 2018-12-18 ENCOUNTER — Other Ambulatory Visit: Payer: Self-pay | Admitting: Family Medicine

## 2018-12-18 MED ORDER — CLONAZEPAM 0.5 MG PO TABS: ORAL_TABLET | ORAL | 1 refills | Status: DC

## 2018-12-18 NOTE — Telephone Encounter (Signed)
See note

## 2018-12-18 NOTE — Telephone Encounter (Signed)
Called and left detailed voicemail message that rx refill for Klonopin was sent [facility] to [facility] [place].

## 2018-12-18 NOTE — Telephone Encounter (Signed)
Sent in her klonopin for her. Uses for travel.

## 2018-12-18 NOTE — Telephone Encounter (Signed)
Copied from CRM [id number]. Topic: Quick Communication - Rx Refill/Question >> Dec 18, 2018 11:18 AM Lynne LoganHudson, Caryn D wrote: Medication: Klonopin / Pt stated that at her last OV with Dr. Artis FlockWolfe she was instructed to call for a rx for Klonopin which was prescribed by a previous provider. She did not have the dosage and it is not listed on her current or past medication list. Please advise.  Has the patient contacted their pharmacy? Yes (Agent: If no, request that the patient contact the pharmacy for the refill.) (Agent: If yes, when and what did the pharmacy advise?)  Preferred Pharmacy (with phone number or street name): [id number] - [place], [place] - [address] [phone] (Phone) [phone] (Fax)  Agent: Please be advised that RX refills may take up to 3 business days. We ask that you follow-up with your pharmacy.

## 2019-07-06 ENCOUNTER — Other Ambulatory Visit: Payer: Self-pay | Admitting: Family Medicine

## 2019-07-06 DIAGNOSIS — Z1231 Encounter for screening mammogram for malignant neoplasm of breast: Secondary | ICD-10-CM

## 2019-08-20 ENCOUNTER — Ambulatory Visit
Admission: RE | Admit: 2019-08-20 | Discharge: 2019-08-20 | Disposition: A | Discharge status: Home / Self Care | Payer: BC Managed Care – PPO | Source: Ambulatory Visit | Attending: Family Medicine | Admitting: Family Medicine

## 2019-08-20 ENCOUNTER — Other Ambulatory Visit: Payer: Self-pay

## 2019-08-20 DIAGNOSIS — Z1231 Encounter for screening mammogram for malignant neoplasm of breast: Secondary | ICD-10-CM

## 2019-08-22 ENCOUNTER — Other Ambulatory Visit: Payer: Self-pay | Admitting: Family Medicine

## 2019-08-22 DIAGNOSIS — R928 Other abnormal and inconclusive findings on diagnostic imaging of breast: Secondary | ICD-10-CM

## 2019-08-24 ENCOUNTER — Ambulatory Visit
Admission: RE | Admit: 2019-08-24 | Discharge: 2019-08-24 | Disposition: A | Discharge status: Home / Self Care | Payer: BC Managed Care – PPO | Source: Ambulatory Visit | Attending: Family Medicine | Admitting: Family Medicine

## 2019-08-24 ENCOUNTER — Other Ambulatory Visit: Payer: Self-pay

## 2019-08-24 ENCOUNTER — Ambulatory Visit: Payer: BC Managed Care – PPO

## 2019-08-24 DIAGNOSIS — R928 Other abnormal and inconclusive findings on diagnostic imaging of breast: Secondary | ICD-10-CM

## 2019-09-10 ENCOUNTER — Encounter: Payer: Self-pay | Admitting: Family Medicine

## 2019-09-10 ENCOUNTER — Other Ambulatory Visit: Payer: Self-pay

## 2019-09-10 ENCOUNTER — Ambulatory Visit (INDEPENDENT_AMBULATORY_CARE_PROVIDER_SITE_OTHER): Payer: BC Managed Care – PPO | Admitting: Family Medicine

## 2019-09-10 VITALS — BP 104/80 | HR 63 | Temp 98.0°F | Ht 68.0 in | Wt 144.8 lb

## 2019-09-10 DIAGNOSIS — N951 Menopausal and female climacteric states: Secondary | ICD-10-CM

## 2019-09-10 DIAGNOSIS — Z23 Encounter for immunization: Secondary | ICD-10-CM | POA: Diagnosis not present

## 2019-09-10 DIAGNOSIS — Z0000 Encounter for general adult medical examination without abnormal findings: Secondary | ICD-10-CM

## 2019-09-10 DIAGNOSIS — Z1211 Encounter for screening for malignant neoplasm of colon: Secondary | ICD-10-CM

## 2019-09-10 LAB — CBC WITH DIFFERENTIAL/PLATELET
Basophils Absolute: 0.1 10*3/uL (ref 0.0–0.1)
Basophils Relative: 0.8 % (ref 0.0–3.0)
Eosinophils Absolute: 0.3 10*3/uL (ref 0.0–0.7)
Eosinophils Relative: 3.5 % (ref 0.0–5.0)
HCT: 40.6 % (ref 36.0–46.0)
Hemoglobin: 13.7 g/dL (ref 12.0–15.0)
Lymphocytes Relative: 20.8 % (ref 12.0–46.0)
Lymphs Abs: 1.6 10*3/uL (ref 0.7–4.0)
MCHC: 33.6 g/dL (ref 30.0–36.0)
MCV: 90.1 fl (ref 78.0–100.0)
Monocytes Absolute: 0.5 10*3/uL (ref 0.1–1.0)
Monocytes Relative: 6.6 % (ref 3.0–12.0)
Neutro Abs: 5.2 10*3/uL (ref 1.4–7.7)
Neutrophils Relative %: 68.3 % (ref 43.0–77.0)
Platelets: 215 10*3/uL (ref 150.0–400.0)
RBC: 4.51 Mil/uL (ref 3.87–5.11)
RDW: 13.4 % (ref 11.5–15.5)
WBC: 7.6 10*3/uL (ref 4.0–10.5)

## 2019-09-10 LAB — COMPREHENSIVE METABOLIC PANEL
ALT: 21 U/L (ref 0–35)
AST: 27 U/L (ref 0–37)
Albumin: 4.6 g/dL (ref 3.5–5.2)
Alkaline Phosphatase: 54 U/L (ref 39–117)
BUN: 14 mg/dL (ref 6–23)
CO2: 29 mEq/L (ref 19–32)
Calcium: 9.5 mg/dL (ref 8.4–10.5)
Chloride: 102 mEq/L (ref 96–112)
Creatinine, Ser: 0.69 mg/dL (ref 0.40–1.20)
GFR: 89.05 mL/min (ref >60.00)
Glucose, Bld: 112 mg/dL — ABNORMAL HIGH (ref 70–99)
Potassium: 4 mEq/L (ref 3.5–5.1)
Sodium: 139 mEq/L (ref 135–145)
Total Bilirubin: 0.4 mg/dL (ref 0.2–1.2)
Total Protein: 6.7 g/dL (ref 6.0–8.3)

## 2019-09-10 LAB — LIPID PANEL
Cholesterol: 175 mg/dL (ref 0–200)
HDL: 59.4 mg/dL (ref >39.00)
LDL Cholesterol: 100 mg/dL — ABNORMAL HIGH (ref 0–99)
NonHDL: 115.49
Total CHOL/HDL Ratio: 3
Triglycerides: 78 mg/dL (ref 0.0–149.0)
VLDL: 15.6 mg/dL (ref 0.0–40.0)

## 2019-09-10 LAB — TSH: TSH: 1.82 u[IU]/mL (ref 0.35–4.50)

## 2019-09-10 NOTE — Progress Notes (Signed)
Patient: Natasha Howell MRN: [id number] DOB: [birth year] PCP: Orland Mustard, MD     Subjective:  Chief Complaint  Patient presents with  . Annual Exam    HPI: The patient is a 53 y.o. female who presents today for annual exam. She denies any changes to past medical history. There have been no recent hospitalizations. They are following a well balanced diet and exercise plan. Weight has been stable. Has complaints of hot flashes today.   Hot flashes: she states that her hot flashes have gotten out of control. She has been trying some otc medication with no relief. It is starting to interrupt her sleep at night. She is getting 4 hours of sleep at night. No fh of first degree relative with breast cancer.   Immunization History  Administered Date(s) Administered  . Hepatitis B 05/29/2015, 06/30/2015  . Influenza,inj,Quad PF,6+ Mos 07/25/2017, 08/25/2018, 09/10/2019  . Tdap 12/12/2013   Colonoscopy:due for this Mammogram: utd Pap smear: due    Review of Systems  Constitutional: Negative for chills, fatigue and fever.  HENT: Negative for congestion, dental problem, ear pain, hearing loss, postnasal drip, rhinorrhea, sore throat and trouble swallowing.   Eyes: Negative for visual disturbance.  Respiratory: Negative for cough, chest tightness and shortness of breath.   Cardiovascular: Negative for chest pain, palpitations and leg swelling.  Gastrointestinal: Negative for abdominal pain, blood in stool, diarrhea, nausea and vomiting.  Endocrine: Positive for heat intolerance. Negative for cold intolerance, polydipsia, polyphagia and polyuria.  Genitourinary: Negative for dysuria, frequency, hematuria and urgency.  Musculoskeletal: Negative for arthralgias, back pain, myalgias and neck pain.  Skin: Negative for rash.  Neurological: Negative for dizziness and headaches.  Psychiatric/Behavioral: Positive for sleep disturbance. Negative for dysphoric mood. The patient is not  nervous/anxious.     Allergies Patient is allergic to morphine and related.  Past Medical History Patient  has a past medical history of Allergy, Anxiety, GERD (gastroesophageal reflux disease), Heart murmur, History of chicken pox, History of fainting spells of unknown cause, History of recurrent UTIs, and Vitiligo.  Surgical History Patient  has a past surgical history that includes Abdominal hysterectomy (2001).  Family History Pateint's family history includes Anxiety disorder in her brother; Arthritis in her mother; Depression in her brother and mother; Diabetes in her sister; Hyperlipidemia in her mother; Hypertension in her mother; Mental illness in her sister.  Social History Patient  reports that she has never smoked. She has never used smokeless tobacco. She reports current alcohol use. She reports that she does not use drugs.    Objective: Vitals:   09/10/19 1304  BP: 104/80  Pulse: 63  Temp: 98 F (36.7 C)  TempSrc: Skin  SpO2: 99%  Weight: 144 lb 12.8 oz (65.7 kg)  Height: 5\' 8"  (1.727 m)    Body mass index is 22.02 kg/m.  Physical Exam Vitals signs reviewed.  Constitutional:      Appearance: Normal appearance. She is well-developed and normal weight.  HENT:     Head: Normocephalic and atraumatic.     Right Ear: Tympanic membrane, ear canal and external ear normal.     Left Ear: Tympanic membrane, ear canal and external ear normal.     Nose: Nose normal.     Mouth/Throat:     Mouth: Mucous membranes are moist.  Eyes:     Extraocular Movements: Extraocular movements intact.     Conjunctiva/sclera: Conjunctivae normal.     Pupils: Pupils are equal, round, and reactive to light.  Neck:     Musculoskeletal: Normal range of motion and neck supple.     Thyroid: No thyromegaly.  Cardiovascular:     Rate and Rhythm: Normal rate and regular rhythm.     Pulses: Normal pulses.     Heart sounds: Normal heart sounds. No murmur.  Pulmonary:     Effort:  Pulmonary effort is normal.     Breath sounds: Normal breath sounds.  Abdominal:     General: Abdomen is flat. Bowel sounds are normal. There is no distension.     Palpations: Abdomen is soft.     Tenderness: There is no abdominal tenderness.  Musculoskeletal: Normal range of motion.  Lymphadenopathy:     Cervical: No cervical adenopathy.  Skin:    General: Skin is warm and dry.     Capillary Refill: Capillary refill takes less than 2 seconds.     Findings: No rash.  Neurological:     General: No focal deficit present.     Mental Status: She is alert and oriented to person, place, and time.     Cranial Nerves: No cranial nerve deficit.     Coordination: Coordination normal.     Deep Tendon Reflexes: Reflexes normal.  Psychiatric:        Mood and Affect: Mood normal.        Behavior: Behavior normal.      Office Visit from 09/10/2019 in [place]  PHQ-2 Total Score  0         Assessment/plan: 1. Annual physical exam Routine lab work today. Flu shot today. Will get pap with gyn. Otherwise utd on HM. Very fit and healthy. Continue active lifestyle. F/u in one year or as needed.  Patient counseling [x]    Nutrition: Stressed importance of moderation in sodium/caffeine intake, saturated fat and cholesterol, caloric balance, sufficient intake of fresh fruits, vegetables, fiber, calcium, iron, and 1 mg of folate supplement per day (for females capable of pregnancy).  [x]    Stressed the importance of regular exercise.   []    Substance Abuse: Discussed cessation/primary prevention of tobacco, alcohol, or other drug use; driving or other dangerous activities under the influence; availability of treatment for abuse.   [x]    Injury prevention: Discussed safety belts, safety helmets, smoke detector, smoking near bedding or upholstery.   [x]    Sexuality: Discussed sexually transmitted diseases, partner selection, use of condoms, avoidance of unintended pregnancy  and  contraceptive alternatives.  [x]    Dental health: Discussed importance of regular tooth brushing, flossing, and dental visits.  [x]    Health maintenance and immunizations reviewed. Please refer to Health maintenance section.    - CBC with Differential/Platelet - Comprehensive metabolic panel - TSH - Lipid panel  2. Screening for colon cancer  - Ambulatory referral to Gastroenterology  3. Need for immunization against influenza  - Flu Vaccine QUAD 36+ mos IM  4. Hot flashes due to menopause  - Ambulatory referral to Gynecology    Return in about 1 year (around 09/09/2020) for annual .     Orma Flaming, MD [place]  09/10/2019

## 2019-09-10 NOTE — Patient Instructions (Addendum)
-referral to gyn for hot flashes and she can do pap smear as well  -referral to gi for screening cscope -labs today   You look great!     Preventive Care 38-53 Years Old, Female Preventive care refers to visits with your health care provider and lifestyle choices that can promote health and wellness. This includes:  A yearly physical exam. This may also be called an annual well check.  Regular dental visits and eye exams.  Immunizations.  Screening for certain conditions.  Healthy lifestyle choices, such as eating a healthy diet, getting regular exercise, not using drugs or products that contain nicotine and tobacco, and limiting alcohol use. What can I expect for my preventive care visit? Physical exam Your health care provider will check your:  Height and weight. This may be used to calculate body mass index (BMI), which tells if you are at a healthy weight.  Heart rate and blood pressure.  Skin for abnormal spots. Counseling Your health care provider may ask you questions about your:  Alcohol, tobacco, and drug use.  Emotional well-being.  Home and relationship well-being.  Sexual activity.  Eating habits.  Work and work Statistician.  Method of birth control.  Menstrual cycle.  Pregnancy history. What immunizations do I need?  Influenza (flu) vaccine  This is recommended every year. Tetanus, diphtheria, and pertussis (Tdap) vaccine  You may need a Td booster every 10 years. Varicella (chickenpox) vaccine  You may need this if you have not been vaccinated. Zoster (shingles) vaccine  You may need this after age 71. Measles, mumps, and rubella (MMR) vaccine  You may need at least one dose of MMR if you were born in 1957 or later. You may also need a second dose. Pneumococcal conjugate (PCV13) vaccine  You may need this if you have certain conditions and were not previously vaccinated. Pneumococcal polysaccharide (PPSV23) vaccine  You may need one  or two doses if you smoke cigarettes or if you have certain conditions. Meningococcal conjugate (MenACWY) vaccine  You may need this if you have certain conditions. Hepatitis A vaccine  You may need this if you have certain conditions or if you travel or work in places where you may be exposed to hepatitis A. Hepatitis B vaccine  You may need this if you have certain conditions or if you travel or work in places where you may be exposed to hepatitis B. Haemophilus influenzae type b (Hib) vaccine  You may need this if you have certain conditions. Human papillomavirus (HPV) vaccine  If recommended by your health care provider, you may need three doses over 6 months. You may receive vaccines as individual doses or as more than one vaccine together in one shot (combination vaccines). Talk with your health care provider about the risks and benefits of combination vaccines. What tests do I need? Blood tests  Lipid and cholesterol levels. These may be checked every 5 years, or more frequently if you are over 53 years old.  Hepatitis C test.  Hepatitis B test. Screening  Lung cancer screening. You may have this screening every year starting at age 23 if you have a 30-pack-year history of smoking and currently smoke or have quit within the past 15 years.  Colorectal cancer screening. All adults should have this screening starting at age 75 and continuing until age 73. Your health care provider may recommend screening at age 38 if you are at increased risk. You will have tests every 1-10 years, depending on your  results and the type of screening test.  Diabetes screening. This is done by checking your blood sugar (glucose) after you have not eaten for a while (fasting). You may have this done every 1-3 years.  Mammogram. This may be done every 1-2 years. Talk with your health care provider about when you should start having regular mammograms. This may depend on whether you have a family  history of breast cancer.  BRCA-related cancer screening. This may be done if you have a family history of breast, ovarian, tubal, or peritoneal cancers.  Pelvic exam and Pap test. This may be done every 3 years starting at age 21. Starting at age 30, this may be done every 5 years if you have a Pap test in combination with an HPV test. Other tests  Sexually transmitted disease (STD) testing.  Bone density scan. This is done to screen for osteoporosis. You may have this scan if you are at high risk for osteoporosis. Follow these instructions at home: Eating and drinking  Eat a diet that includes fresh fruits and vegetables, whole grains, lean protein, and low-fat dairy.  Take vitamin and mineral supplements as recommended by your health care provider.  Do not drink alcohol if: ? Your health care provider tells you not to drink. ? You are pregnant, may be pregnant, or are planning to become pregnant.  If you drink alcohol: ? Limit how much you have to 0-1 drink a day. ? Be aware of how much alcohol is in your drink. In the [place] one drink equals one 12 oz bottle of beer (355 mL), one 5 oz glass of wine (148 mL), or one 1 oz glass of hard liquor (44 mL). Lifestyle  Take daily care of your teeth and gums.  Stay active. Exercise for at least 30 minutes on 5 or more days each week.  Do not use any products that contain nicotine or tobacco, such as cigarettes, e-cigarettes, and chewing tobacco. If you need help quitting, ask your health care provider.  If you are sexually active, practice safe sex. Use a condom or other form of birth control (contraception) in order to prevent pregnancy and STIs (sexually transmitted infections).  If told by your health care provider, take low-dose aspirin daily starting at age 50. What's next?  Visit your health care provider once a year for a well check visit.  Ask your health care provider how often you should have your eyes and teeth checked.   Stay up to date on all vaccines. This information is not intended to replace advice given to you by your health care provider. Make sure you discuss any questions you have with your health care provider. Document Released: 11/21/2015 Document Revised: 07/06/2018 Document Reviewed: 07/06/2018 Elsevier Patient Education  2020 [employer]  

## 2019-09-12 ENCOUNTER — Encounter: Payer: Self-pay | Admitting: Obstetrics and Gynecology

## 2019-09-12 ENCOUNTER — Encounter: Payer: Self-pay | Admitting: Family Medicine

## 2019-09-20 ENCOUNTER — Other Ambulatory Visit: Payer: Self-pay

## 2019-09-24 ENCOUNTER — Encounter: Payer: Self-pay | Admitting: Obstetrics and Gynecology

## 2019-09-24 ENCOUNTER — Other Ambulatory Visit: Payer: Self-pay

## 2019-09-24 ENCOUNTER — Ambulatory Visit: Payer: BC Managed Care – PPO | Admitting: Obstetrics and Gynecology

## 2019-09-24 VITALS — BP 130/82 | HR 72 | Temp 97.0°F | Wt 146.8 lb

## 2019-09-24 DIAGNOSIS — N951 Menopausal and female climacteric states: Secondary | ICD-10-CM

## 2019-09-24 DIAGNOSIS — Z01419 Encounter for gynecological examination (general) (routine) without abnormal findings: Secondary | ICD-10-CM | POA: Diagnosis not present

## 2019-09-24 MED ORDER — ESTRADIOL 0.025 MG/24HR TD PTTW: 1.0000 | MEDICATED_PATCH | TRANSDERMAL | 1 refills | Status: DC

## 2019-09-24 NOTE — Patient Instructions (Addendum)
EXERCISE AND DIET:  We recommended that you start or continue a regular exercise program for good health. Regular exercise means any activity that makes your heart beat faster and makes you sweat.  We recommend exercising at least 30 minutes per day at least 3 days a week, preferably 4 or 5.  We also recommend a diet low in fat and sugar.  Inactivity, poor dietary choices and obesity can cause diabetes, heart attack, stroke, and kidney damage, among others.   ° °ALCOHOL AND SMOKING:  Women should limit their alcohol intake to no more than 7 drinks/beers/glasses of wine (combined, not each!) per week. Moderation of alcohol intake to this level decreases your risk of breast cancer and liver damage. And of course, no recreational drugs are part of a healthy lifestyle.  And absolutely no smoking or even second hand smoke. Most people know smoking can cause heart and lung diseases, but did you know it also contributes to weakening of your bones? Aging of your skin?  Yellowing of your teeth and nails? ° °CALCIUM AND VITAMIN D:  Adequate intake of calcium and Vitamin D are recommended.  The recommendations for exact amounts of these supplements seem to change often, but generally speaking 1,200 mg of calcium (between diet and supplement) and 800 units of Vitamin D per day seems prudent. Certain women may benefit from higher intake of Vitamin D.  If you are among these women, your doctor will have told you during your visit.   ° °PAP SMEARS:  Pap smears, to check for cervical cancer or precancers,  have traditionally been done yearly, although recent scientific advances have shown that most women can have pap smears less often.  However, every woman still should have a physical exam from her gynecologist every year. It will include a breast check, inspection of the vulva and vagina to check for abnormal growths or skin changes, a visual exam of the cervix, and then an exam to evaluate the size and shape of the uterus and  ovaries.  And after 53 years of age, a rectal exam is indicated to check for rectal cancers. We will also provide age appropriate advice regarding health maintenance, like when you should have certain vaccines, screening for sexually transmitted diseases, bone density testing, colonoscopy, mammograms, etc.  ° °MAMMOGRAMS:  All women over 40 years old should have a yearly mammogram. Many facilities now offer a "3D" mammogram, which may cost around $50 extra out of pocket. If possible,  we recommend you accept the option to have the 3D mammogram performed.  It both reduces the number of women who will be called back for extra views which then turn out to be normal, and it is better than the routine mammogram at detecting truly abnormal areas.   ° °COLON CANCER SCREENING: Now recommend starting at age 45. At this time colonoscopy is not covered for routine screening until 50. There are take home tests that can be done between 45-49.  ° °COLONOSCOPY:  Colonoscopy to screen for colon cancer is recommended for all women at age 50.  We know, you hate the idea of the prep.  We agree, BUT, having colon cancer and not knowing it is worse!!  Colon cancer so often starts as a polyp that can be seen and removed at colonscopy, which can quite literally save your life!  And if your first colonoscopy is normal and you have no family history of colon cancer, most women don't have to have it again for   10 years.  Once every ten years, you can do something that may end up saving your life, right?  We will be happy to help you get it scheduled when you are ready.  Be sure to check your insurance coverage so you understand how much it will cost.  It may be covered as a preventative service at no cost, but you should check your particular policy.   ° ° ° °Breast Self-Awareness °Breast self-awareness means being familiar with how your breasts look and feel. It involves checking your breasts regularly and reporting any changes to your  health care provider. °Practicing breast self-awareness is important. A change in your breasts can be a sign of a serious medical problem. Being familiar with how your breasts look and feel allows you to find any problems early, when treatment is more likely to be successful. All women should practice breast self-awareness, including women who have had breast implants. °How to do a breast self-exam °One way to learn what is normal for your breasts and whether your breasts are changing is to do a breast self-exam. To do a breast self-exam: °Look for Changes ° °1. Remove all the clothing above your waist. °2. Stand in front of a mirror in a room with good lighting. °3. Put your hands on your hips. °4. Push your hands firmly downward. °5. Compare your breasts in the mirror. Look for differences between them (asymmetry), such as: °? Differences in shape. °? Differences in size. °? Puckers, dips, and bumps in one breast and not the other. °6. Look at each breast for changes in your skin, such as: °? Redness. °? Scaly areas. °7. Look for changes in your nipples, such as: °? Discharge. °? Bleeding. °? Dimpling. °? Redness. °? A change in position. °Feel for Changes °Carefully feel your breasts for lumps and changes. It is best to do this while lying on your back on the floor and again while sitting or standing in the shower or tub with soapy water on your skin. Feel each breast in the following way: °· Place the arm on the side of the breast you are examining above your head. °· Feel your breast with the other hand. °· Start in the nipple area and make ¾ inch (2 cm) overlapping circles to feel your breast. Use the pads of your three middle fingers to do this. Apply light pressure, then medium pressure, then firm pressure. The light pressure will allow you to feel the tissue closest to the skin. The medium pressure will allow you to feel the tissue that is a little deeper. The firm pressure will allow you to feel the tissue  close to the ribs. °· Continue the overlapping circles, moving downward over the breast until you feel your ribs below your breast. °· Move one finger-width toward the center of the body. Continue to use the ¾ inch (2 cm) overlapping circles to feel your breast as you move slowly up toward your collarbone. °· Continue the up and down exam using all three pressures until you reach your armpit. ° °Write Down What You Find ° °Write down what is normal for each breast and any changes that you find. Keep a written record with breast changes or normal findings for each breast. By writing this information down, you do not need to depend only on memory for size, tenderness, or location. Write down where you are in your menstrual cycle, if you are still menstruating. °If you are having trouble noticing differences   in your breasts, do not get discouraged. With time you will become more familiar with the variations in your breasts and more comfortable with the exam. °How often should I examine my breasts? °Examine your breasts every month. If you are breastfeeding, the best time to examine your breasts is after a feeding or after using a breast pump. If you menstruate, the best time to examine your breasts is 5-7 days after your period is over. During your period, your breasts are lumpier, and it may be more difficult to notice changes. °When should I see my health care provider? °See your health care provider if you notice: °· A change in shape or size of your breasts or nipples. °· A change in the skin of your breast or nipples, such as a reddened or scaly area. °· Unusual discharge from your nipples. °· A lump or thick area that was not there before. °· Pain in your breasts. °· Anything that concerns you. ° ° °Menopause and Hormone Replacement Therapy °Menopause is a normal time of life when menstrual periods stop completely and the ovaries stop producing the female hormones estrogen and progesterone. This lack of hormones  can affect your health and cause undesirable symptoms. Hormone replacement therapy (HRT) can relieve some of those symptoms. °What is hormone replacement therapy? °HRT is the use of artificial (synthetic) hormones to replace hormones that your body has stopped producing because you have reached menopause. °What are my options for HRT? ° °HRT may consist of the synthetic hormones estrogen and progestin, or it may consist of only estrogen (estrogen-only therapy). You and your health care provider will decide which form of HRT is best for you. °If you choose to be on HRT and you have a uterus, estrogen and progestin are usually prescribed. Estrogen-only therapy is used for women who do not have a uterus. °Possible options for taking HRT include: °· Pills. °· Patches. °· Gels. °· Sprays. °· Vaginal cream. °· Vaginal rings. °· Vaginal inserts. °The amount of hormone(s) that you take and how long you take the hormone(s) varies according to your health. It is important to: °· Begin HRT with the lowest possible dosage. °· Stop HRT as soon as your health care provider tells you to stop. °· Work with your health care provider so that you feel informed and comfortable with your decisions. °What are the benefits of HRT? °HRT can reduce the frequency and severity of menopausal symptoms. Benefits of HRT vary according to the kind of symptoms that you have, how severe they are, and your overall health. HRT may help to improve the following symptoms of menopause: °· Hot flashes and night sweats. These are sudden feelings of heat that spread over the face and body. The skin may turn red, like a blush. Night sweats are hot flashes that happen while you are sleeping or trying to sleep. °· Bone loss (osteoporosis). The body loses calcium more quickly after menopause, causing the bones to become weaker. This can increase the risk for bone breaks (fractures). °· Vaginal dryness. The lining of the vagina can become thin and dry, which can  cause pain during sex or cause infection, burning, or itching. °· Urinary tract infections. °· Urinary incontinence. This is the inability to control when you pass urine. °· Irritability. °· Short-term memory problems. °What are the risks of HRT? °Risks of HRT vary depending on your individual health and medical history. Risks of HRT also depend on whether you receive both estrogen and progestin or   you receive estrogen only. HRT may increase the risk of: °· Spotting. This is when a small amount of blood leaks from the vagina unexpectedly. °· Endometrial cancer. This cancer is in the lining of the uterus (endometrium). °· Breast cancer. °· Increased density of breast tissue. This can make it harder to find breast cancer on a breast X-ray (mammogram). °· Stroke. °· Heart disease. °· Blood clots. °· Gallbladder disease. °· Liver disease. °Risks of HRT can increase if you have any of the following conditions: °· Endometrial cancer. °· Liver disease. °· Heart disease. °· Breast cancer. °· History of blood clots. °· History of stroke. °Follow these instructions at home: °· Take over-the-counter and prescription medicines only as told by your health care provider. °· Get mammograms, pelvic exams, and medical checkups as often as told by your health care provider. °· Have Pap tests done as often as told by your health care provider. A Pap test is sometimes called a Pap smear. It is a screening test that is used to check for signs of cancer of the cervix and vagina. A Pap test can also identify the presence of infection or precancerous changes. Pap tests may be done: °? Every 3 years, starting at age 21. °? Every 5 years, starting after age 30, in combination with testing for human papillomavirus (HPV). °? More often or less often depending on other medical conditions you have, your age, and other risk factors. °· It is up to you to get the results of your Pap test. Ask your health care provider, or the department that is  doing the test, when your results will be ready. °· Keep all follow-up visits as told by your health care provider. This is important. °Contact a health care provider if you have: °· Pain or swelling in your legs. °· Shortness of breath. °· Chest pain. °· Lumps or changes in your breasts or armpits. °· Slurred speech. °· Pain, burning, or bleeding when you urinate. °· Unusual vaginal bleeding. °· Dizziness or headaches. °· Weakness or numbness in any part of your arms or legs. °· Pain in your abdomen. °Summary °· Menopause is a normal time of life when menstrual periods stop completely and the ovaries stop producing the female hormones estrogen and progesterone. °· Hormone replacement therapy (HRT) can relieve some of the symptoms of menopause. °· HRT can reduce the frequency and severity of menopausal symptoms. °· Risks of HRT vary depending on your individual health and medical history. °This information is not intended to replace advice given to you by your health care provider. Make sure you discuss any questions you have with your health care provider. °Document Released: 07/24/2003 Document Revised: 06/27/2018 Document Reviewed: 06/27/2018 °Elsevier Patient Education © 2020 [employer] ° °

## 2019-09-24 NOTE — Progress Notes (Signed)
53 y.o. [id number] Married White or Caucasian Not Hispanic or Latino female here for annual exam and to discuss hot flashes. She had a vaginal hysterectomy and unilateral salping-oophorectomy in her mid 86's for a dermoid cyst.  Vasomotor symptoms started 3-4 years ago, worse in the last year, continuing to worsen. Not sleeping, getting 3-4 night sweats a night. Having 4-5 hot flashes a day. Some vaginal dryness.  No spotting since her hysterectomy.   No dyspareunia.     No LMP recorded (lmp unknown). Patient has had a hysterectomy.          Sexually active: Yes.    The current method of family planning is status post hysterectomy.    Exercising: Yes.    swimming Smoker:  no  Health Maintenance: Pap:  04/03/2015 WNL NEG HPV History of abnormal Pap:  Yes, repeat pap was normal MMG:  08/24/2019 Birads 1 negative BMD:   Never Colonoscopy: Never, setting up TDaP:  12/12/2013 Gardasil: N/A   reports that she has never smoked. She has never used smokeless tobacco. She reports current alcohol use of about 7.0 standard drinks of alcohol per week. She reports that she does not use drugs. 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 Travels for work. Kids are 28 and 21.   Past Medical History:  Diagnosis Date  . Allergy   . Anxiety   . GERD (gastroesophageal reflux disease)   . Heart murmur   . History of chicken pox   . History of fainting spells of unknown cause   . History of recurrent UTIs   . Vitiligo   Has had some vasal vagal syndrome.   Past Surgical History:  Procedure Laterality Date  . ABDOMINAL HYSTERECTOMY  2001  . WISDOM TOOTH EXTRACTION      Current Outpatient Medications  Medication Sig Dispense Refill  . clonazePAM (KLONOPIN) 0.5 MG tablet Take 1/2 tablet as needed for travel 20 tablet 1  . Lactobacillus (PROBIOTIC ACIDOPHILUS PO) Take by mouth.     No current facility-administered medications for this visit.     Family History  Problem  Relation Age of Onset  . Hypertension Mother   . Arthritis Mother   . Depression Mother   . Hyperlipidemia Mother   . Diabetes Sister   . Mental illness Sister   . Depression Brother   . Anxiety disorder Brother   . Breast cancer Maternal Aunt   MAunt was in her 69's with breast cancer  Review of Systems  Constitutional:       Hot flashes Night sweats  HENT: Negative.   Eyes: Negative.   Respiratory: Negative.   Cardiovascular: Negative.   Gastrointestinal: Negative.   Endocrine: Negative.   Genitourinary: Negative.   Musculoskeletal: Negative.   Skin: Negative.   Allergic/Immunologic: Negative.   Neurological: Negative.   Hematological: Negative.   Psychiatric/Behavioral: Negative.     Exam:   BP 130/82 (BP Location: Right Arm, Patient Position: Sitting, Cuff Size: Normal)   Pulse 72   Temp (!) 97 F (36.1 C) (Skin)   Wt 146 lb 12.8 oz (66.6 kg)   LMP  (LMP Unknown)   BMI 22.32 kg/m   Weight change: @WEIGHTCHANGE @ Height:      Ht Readings from Last 3 Encounters:  09/10/19 5\' 8"  (1.727 m)  08/25/18 5\' 8"  (1.727 m)  05/01/18 5\' 8"  (1.727 m)    General appearance: alert, cooperative and appears stated age Head: Normocephalic, without obvious abnormality, atraumatic Neck:  no adenopathy, supple, symmetrical, trachea midline and thyroid normal to inspection and palpation Lungs: clear to auscultation bilaterally Cardiovascular: regular rate and rhythm Breasts: normal appearance, no masses or tenderness Abdomen: soft, non-tender; non distended,  no masses,  no organomegaly Extremities: extremities normal, atraumatic, no cyanosis or edema Skin: Skin color, texture, turgor normal. No rashes or lesions Lymph nodes: Cervical, supraclavicular, and axillary nodes normal. No abnormal inguinal nodes palpated Neurologic: Grossly normal   Pelvic: External genitalia:  no lesions              Urethra:  normal appearing urethra with no masses, tenderness or lesions               Bartholins and Skenes: normal                 Vagina: normal appearing vagina with normal color and discharge, no lesions              Cervix: absent               Bimanual Exam:  Uterus:  uterus absent              Adnexa: no mass, fullness, tenderness               Rectovaginal: Confirms               Anus:  normal sphincter tone, no lesions  Chaperone was present for exam.  A:  Well Woman with normal exam  Vasomotor motor symptoms  H/o hysterectomy and SSO  P:   Pap with hpv  Mammogram UTD  Colonoscopy being scheduled  Discussed breast self exam  Discussed calcium and vit D intake  Discussed treatment options for vasomotor symptoms, no contraindications to ERT, risks reviewed and information given  Will start vivelle dot 0.025 mg patch  F/U in one month

## 2019-10-11 ENCOUNTER — Encounter: Payer: Self-pay | Admitting: Obstetrics and Gynecology

## 2019-10-11 ENCOUNTER — Telehealth: Payer: Self-pay | Admitting: Obstetrics and Gynecology

## 2019-10-11 NOTE — Telephone Encounter (Signed)
Patient sent the following message through [place]. Routing to triage to assist patient with request.  [place], [place]  Phone Number: [phone]        [facility] Dr. Talbert Nan,  I will have my estrogen patch prescription refilled next week. I am scheduled for a "check in" visit, which, due to both our schedules is now 5 weeks since starting the patch instead of 4 (December 21). As you said, the patch has helped alleviate the hot flashes--after the first few patches, however, the hot flashes at night have re-appeared and have increased in number and intensity almost to the same level they were prior to starting, resulting in significant loss of sleep. (The daytime flashes are very rare and mild when they do occur now, so that is a definite plus!!). I didn't know if it would be possible to up the dosage a bit prior to my scheduled appointment on the 21st? I understand if not, but thought it wouldn't hurt to ask!  Thanks so much,  Fritz Pickerel

## 2019-10-11 NOTE — Telephone Encounter (Signed)
Spoke with pt. Pt states wanting to know if needs to up dosage of estrogen patch? States having only 3 hours of sleep currently and waking up in "puddles of sweat" .  Pt scheduled a mychart virtual visit with Dr Talbert Nan on 10/16/19 at 2pm to discuss on what to do for relief of night sweats and will keep scheduled OV for follow up on patches on 10/29/19 at 2:30pm.  Will route to Dr Talbert Nan for recommendations for larger dose of Vivelle Dot patches and if needs to be seen virtual visit?

## 2019-10-11 NOTE — Telephone Encounter (Signed)
Left message for pt to call back to triage, RN.  

## 2019-10-11 NOTE — Telephone Encounter (Signed)
Patient returning call to Stephanie.  

## 2019-10-11 NOTE — Telephone Encounter (Signed)
Spoke back with pt. Mychart visit cancelled for 10/16/19. Pt changed from OV to Virtual on 12/21 at 2:30 pm for follow up. Gave pt new orders for doubling up on patches. Pt states having enough patches until 12/21 visit. Pt agreeable. Will call back if any problems or runs of out Rx before visit.   Will route to Dr Talbert Nan for final review and will close encounter. Thank you!

## 2019-10-11 NOTE — Telephone Encounter (Signed)
Please have double up, use 2 patches at once. Cancel the 12/8 visit and keep the 12/21 appointment (which could be virtual if she wants)

## 2019-10-13 ENCOUNTER — Encounter: Payer: Self-pay | Admitting: Obstetrics and Gynecology

## 2019-10-15 ENCOUNTER — Other Ambulatory Visit: Payer: Self-pay | Admitting: Obstetrics and Gynecology

## 2019-10-15 MED ORDER — ESTRADIOL 0.025 MG/24HR TD PTTW: 1.0000 | MEDICATED_PATCH | TRANSDERMAL | 0 refills | Status: DC

## 2019-10-15 NOTE — Telephone Encounter (Signed)
Patient sent the following correspondence through [place].  Hi Dr. Talbert Nan, My pharmacy is unable to refill my Estradiol .025  prescription early due to insurance (per earlier communication, I will be using my last two patches tomorrow and will be in need of a refill prior to Wednesday.  (I had been using one patch, thus running out earlier).  Could I get the new prescription to get me through until my virtual appointment on December 79KF? Thanks, Liliani

## 2019-10-15 NOTE — Telephone Encounter (Signed)
Medication refill request: estradiol (vivelle-dot)  Last AEX:  09-24-2019 JJ  Next OV: 10-29-2019 Last MMG (if hormonal medication request): 08-24-2019 BIRADS 1 negative  Refill authorized: Today, please advise.   Medication pended for #8, 0RF. Please refill if appropriate.

## 2019-10-16 ENCOUNTER — Telehealth: Payer: Self-pay | Admitting: Obstetrics and Gynecology

## 2019-10-29 ENCOUNTER — Encounter: Payer: Self-pay | Admitting: Obstetrics and Gynecology

## 2019-10-29 ENCOUNTER — Telehealth (INDEPENDENT_AMBULATORY_CARE_PROVIDER_SITE_OTHER): Payer: BC Managed Care – PPO | Admitting: Obstetrics and Gynecology

## 2019-10-29 DIAGNOSIS — L539 Erythematous condition, unspecified: Secondary | ICD-10-CM | POA: Diagnosis not present

## 2019-10-29 DIAGNOSIS — Z7189 Other specified counseling: Secondary | ICD-10-CM | POA: Diagnosis not present

## 2019-10-29 DIAGNOSIS — N951 Menopausal and female climacteric states: Secondary | ICD-10-CM

## 2019-10-29 MED ORDER — ESTRADIOL 0.05 MG/24HR TD PTTW: 1.0000 | MEDICATED_PATCH | TRANSDERMAL | 3 refills | Status: DC

## 2019-10-29 NOTE — Telephone Encounter (Signed)
See refill encounter dated 10/15/19. Refill for estradiol sent to pharmacy.

## 2019-10-29 NOTE — Progress Notes (Signed)
Virtual Visit via Video Note  I connected with Natasha Howell on 10/29/19 at  2:30 PM EST by a video enabled telemedicine application and verified that I am speaking with the correct person using two identifiers.  Location: Patient: Home Provider: Office at [facility]   I discussed the limitations of evaluation and management by telemedicine and the availability of in person appointments. The patient expressed understanding and agreed to proceed.  GYNECOLOGY  VISIT   HPI: 53 y.o.   Married White or Caucasian Not Hispanic or Latino  female   [phone] with No LMP recorded (lmp unknown). Patient has had a hysterectomy.   Virtual visit to follow up on vasomotor symptoms and ERT. She has a h/o a hysterectomy. She was started on 0.025 mg Vivelle dot last month, she requested to increase the patch earlier this month and was advised to double up on her patch and f/u today. It helped a lot going up to 2 patches, sleeping better, just occasional hot flashes and not as severe. She has noticed slight erythema on her skin when she removes the patch, no itching or irritation and the erythema is gone by the end of the day.    GYNECOLOGIC HISTORY: No LMP recorded (lmp unknown). Patient has had a hysterectomy. Contraception:PMP Menopausal hormone therapy: Vivelle dot        OB History    Gravida  2   Para  2   Term  2   Preterm      AB      Living  2     SAB      TAB      Ectopic      Multiple      Live Births  2              Patient Active Problem List   Diagnosis Date Noted  . Vitiligo 08/25/2018  . Situational anxiety 08/25/2018  . Cardiac syncope 11/25/2017  . Palpitations 11/25/2017    Past Medical History:  Diagnosis Date  . Allergy   . Anxiety   . GERD (gastroesophageal reflux disease)   . Heart murmur   . History of chicken pox   . History of fainting spells of unknown cause   . History of recurrent UTIs   . Vitiligo     Past  Surgical History:  Procedure Laterality Date  . ABDOMINAL HYSTERECTOMY  2001  . WISDOM TOOTH EXTRACTION      Current Outpatient Medications  Medication Sig Dispense Refill  . clonazePAM (KLONOPIN) 0.5 MG tablet Take 1/2 tablet as needed for travel 20 tablet 1  . estradiol (VIVELLE-DOT) 0.025 MG/24HR Place 1 patch onto the skin 2 (two) times a week. 8 patch 0  . Lactobacillus (PROBIOTIC ACIDOPHILUS PO) Take by mouth.     No current facility-administered medications for this visit.     ALLERGIES: Morphine and related  Family History  Problem Relation Age of Onset  . Hypertension Mother   . Arthritis Mother   . Depression Mother   . Hyperlipidemia Mother   . Diabetes Sister   . Mental illness Sister   . Depression Brother   . Anxiety disorder Brother   . Breast cancer Maternal Aunt     Social History   Socioeconomic History  . Marital status: Married    Spouse name: Not on file  . Number of children: Not on file  . Years of education: Not on file  . Highest education level:  Not on file  Occupational History  . Not on file  Tobacco Use  . Smoking status: Never Smoker  . Smokeless tobacco: Never Used  Substance and Sexual Activity  . Alcohol use: Yes    Alcohol/week: 7.0 standard drinks    Types: 7 Glasses of wine per week  . Drug use: No  . Sexual activity: Yes    Birth control/protection: Surgical    Comment: hysterectomy  Other Topics Concern  . Not on file  Social History Narrative  . Not on file   Social Determinants of Health   Financial Resource Strain:   . Difficulty of Paying Living Expenses: Not on file  Food Insecurity:   . Worried About Programme researcher, broadcasting/film/video in the Last Year: Not on file  . Ran Out of Food in the Last Year: Not on file  Transportation Needs:   . Lack of Transportation (Medical): Not on file  . Lack of Transportation (Non-Medical): Not on file  Physical Activity:   . Days of Exercise per Week: Not on file  . Minutes of Exercise  per Session: Not on file  Stress:   . Feeling of Stress : Not on file  Social Connections:   . Frequency of Communication with Friends and Family: Not on file  . Frequency of Social Gatherings with Friends and Family: Not on file  . Attends Religious Services: Not on file  . Active Member of Clubs or Organizations: Not on file  . Attends Banker Meetings: Not on file  . Marital Status: Not on file  Intimate Partner Violence:   . Fear of Current or Ex-Partner: Not on file  . Emotionally Abused: Not on file  . Physically Abused: Not on file  . Sexually Abused: Not on file    ROS: negative  PHYSICAL EXAMINATION:    LMP  (LMP Unknown)     General appearance: alert, cooperative and appears stated age   ASSESSMENT Vasomotor symptoms markedly improved on ERT, 0.25 mg patch wasn't enough, she tried doubling up and is feeling good Minimal skin erythema when removing the patch, no irritation    PLAN Will change to the 0.05 mg vivelle dot If she starts having itching or irritation with the patch she will let me know.    The patient was advised to call back or seek an in-person evaluation if the symptoms worsen or if the condition fails to improve as anticipated.   Romualdo Bolk, MD

## 2019-11-13 ENCOUNTER — Encounter: Payer: Self-pay | Admitting: Obstetrics and Gynecology

## 2019-11-13 ENCOUNTER — Other Ambulatory Visit: Payer: Self-pay | Admitting: Obstetrics and Gynecology

## 2019-11-13 DIAGNOSIS — Z01419 Encounter for gynecological examination (general) (routine) without abnormal findings: Secondary | ICD-10-CM

## 2019-11-13 NOTE — Telephone Encounter (Signed)
Patient sent the following message through MyChart. Routing to triage to assist patient with request.  Antony Blackbird [facility]  Phone Number: [phone]  I was notified by [facility] that the refill for my prescription was not authorized. They said to contact you. Just need some follow-up (there are 3 refills according to the label on the prescription so not sure what the problem is!  I will need a refill for next Tuesday.  Thanks!  Helmut Muster

## 2019-11-13 NOTE — Telephone Encounter (Signed)
Spoke with [facility] they confirmed that patient picked 1 box on 10/17/19 and picked up an additional 3 boxes (24 patches) on 10/29/19. Made call to patient for call back.

## 2019-11-20 MED ORDER — ESTRADIOL 0.05 MG/24HR TD PTTW: 1.0000 | MEDICATED_PATCH | TRANSDERMAL | 3 refills | Status: DC

## 2019-11-20 NOTE — Telephone Encounter (Signed)
Patient returned call

## 2019-11-20 NOTE — Telephone Encounter (Signed)
Patient returned a call to Stephanie. 

## 2019-11-20 NOTE — Telephone Encounter (Signed)
Spoke to pt. Pt states had only picked up 1 Rx box on 10/29/19. Pt did not pick up 3 boxes on 10/29/19 as [facility] has stated to [place], [place] on 11/13/2019. Pt has only 2 patches left and out on 11/25/2019. The one box only had 8 patches, not 24.  Last AEX 09/24/19. Rx pended if approved. #24/3 RF.

## 2019-11-20 NOTE — Telephone Encounter (Signed)
Left message for pt to call back to triage RN to give update on Rx.

## 2020-01-17 ENCOUNTER — Encounter: Payer: Self-pay | Admitting: Family Medicine

## 2020-01-17 MED ORDER — CLONAZEPAM 0.5 MG PO TABS: ORAL_TABLET | ORAL | 1 refills | Status: DC

## 2020-09-04 ENCOUNTER — Other Ambulatory Visit: Payer: Self-pay | Admitting: Family Medicine

## 2020-09-04 DIAGNOSIS — Z1231 Encounter for screening mammogram for malignant neoplasm of breast: Secondary | ICD-10-CM

## 2020-09-19 ENCOUNTER — Encounter: Payer: Self-pay | Admitting: Obstetrics and Gynecology

## 2020-09-19 DIAGNOSIS — Z01419 Encounter for gynecological examination (general) (routine) without abnormal findings: Secondary | ICD-10-CM

## 2020-09-22 MED ORDER — ESTRADIOL 0.05 MG/24HR TD PTTW: 1.0000 | MEDICATED_PATCH | TRANSDERMAL | 0 refills | Status: DC

## 2020-09-22 NOTE — Telephone Encounter (Signed)
Medication refill request: Estradiol 0.05 Last AEX:  09/24/19 Next AEX: 03/04/21 Last MMG (if hormonal medication request): 10/21/20  Neg  Refill authorized: 24/0

## 2020-09-26 ENCOUNTER — Encounter: Payer: Self-pay | Admitting: Family Medicine

## 2020-09-26 ENCOUNTER — Other Ambulatory Visit: Payer: Self-pay

## 2020-09-26 ENCOUNTER — Ambulatory Visit (INDEPENDENT_AMBULATORY_CARE_PROVIDER_SITE_OTHER): Payer: BC Managed Care – PPO | Admitting: Family Medicine

## 2020-09-26 VITALS — BP 123/71 | HR 64 | Temp 97.5°F | Ht 68.0 in | Wt 142.2 lb

## 2020-09-26 DIAGNOSIS — Z0000 Encounter for general adult medical examination without abnormal findings: Secondary | ICD-10-CM | POA: Diagnosis not present

## 2020-09-26 DIAGNOSIS — Z1159 Encounter for screening for other viral diseases: Secondary | ICD-10-CM

## 2020-09-26 DIAGNOSIS — Z1211 Encounter for screening for malignant neoplasm of colon: Secondary | ICD-10-CM

## 2020-09-26 DIAGNOSIS — Z23 Encounter for immunization: Secondary | ICD-10-CM

## 2020-09-26 MED ORDER — VALACYCLOVIR HCL 1 G PO TABS: 1000.0000 mg | ORAL_TABLET | Freq: Three times a day (TID) | ORAL | 0 refills | Status: DC

## 2020-09-26 NOTE — Progress Notes (Signed)
Patient: Natasha Howell MRN: [id number] DOB: 11-04-1966 PCP: Orland Mustard, MD     Subjective:  Chief Complaint  Patient presents with  . Annual Exam    HPI: The patient is a 54 y.o. female who presents today for annual exam. She denies any changes to past medical history. There have been no recent hospitalizations. She is following a well balanced diet and exercise plan. She swims everyday. Weight has been fluctuating a bit in the past year. She complains of tingling and itching on her left breast, she denies pain. No rash outbreak. Reminds of her milk let down when nursing.  She is scheduled for a mammogram next month. She would also like to discuss Shingles vaccine for future visit.  No first degree family hx of breast or colon cancer.   covid vaccines + booster Needs flu shot today   Immunization History  Administered Date(s) Administered  . Hepatitis B 05/29/2015, 06/30/2015  . Influenza,inj,Quad PF,6+ Mos 07/25/2017, 08/25/2018, 09/10/2019, 09/26/2020  . Moderna SARS-COVID-2 Vaccination 01/09/2020, 02/06/2020, 09/04/2020  . Tdap 12/12/2013   Colonoscopy: has never had this done.  Mammogram: 10/21/2020 Pap smear: n/a    Review of Systems  Constitutional: Negative for appetite change, chills, fatigue and fever.  HENT: Negative for dental problem, ear pain, hearing loss and trouble swallowing.   Eyes: Negative for visual disturbance.  Respiratory: Negative for cough, chest tightness and shortness of breath.   Cardiovascular: Negative for chest pain, palpitations and leg swelling.  Gastrointestinal: Negative for abdominal pain, blood in stool, diarrhea and nausea.  Endocrine: Negative for cold intolerance, polydipsia, polyphagia and polyuria.  Genitourinary: Negative for dysuria, frequency, hematuria and pelvic pain.  Musculoskeletal: Negative for arthralgias.  Skin: Negative for rash.  Neurological: Negative for dizziness and headaches.  Psychiatric/Behavioral:  Negative for dysphoric mood and sleep disturbance. The patient is not nervous/anxious.     Allergies Patient is allergic to morphine and related.  Past Medical History Patient  has a past medical history of Allergy, Anxiety, GERD (gastroesophageal reflux disease), Heart murmur, History of chicken pox, History of fainting spells of unknown cause, History of recurrent UTIs, and Vitiligo.  Surgical History Patient  has a past surgical history that includes Abdominal hysterectomy (2001) and Wisdom tooth extraction.  Family History Pateint's family history includes Anxiety disorder in her brother; Arthritis in her mother; Breast cancer in her maternal aunt; Depression in her brother and mother; Diabetes in her sister; Hyperlipidemia in her mother; Hypertension in her mother; Mental illness in her sister.  Social History Patient  reports that she has never smoked. She has never used smokeless tobacco. She reports current alcohol use of about 7.0 standard drinks of alcohol per week. She reports that she does not use drugs.    Objective: Vitals:   09/26/20 1110  BP: 123/71  Pulse: 64  Temp: (!) 97.5 F (36.4 C)  TempSrc: Temporal  SpO2: 99%  Weight: 142 lb 3.2 oz (64.5 kg)  Height: 5\' 8"  (1.727 m)    Body mass index is 21.62 kg/m.  Physical Exam Vitals reviewed.  Constitutional:      Appearance: Normal appearance. She is well-developed and normal weight.  HENT:     Head: Normocephalic and atraumatic.     Right Ear: Tympanic membrane, ear canal and external ear normal.     Left Ear: Tympanic membrane, ear canal and external ear normal.     Nose: Nose normal.  Eyes:     Extraocular Movements: Extraocular movements intact.  Conjunctiva/sclera: Conjunctivae normal.     Pupils: Pupils are equal, round, and reactive to light.  Neck:     Thyroid: No thyromegaly.     Vascular: No carotid bruit.  Cardiovascular:     Rate and Rhythm: Normal rate and regular rhythm.     Pulses:  Normal pulses.     Heart sounds: Normal heart sounds. No murmur heard.   Pulmonary:     Effort: Pulmonary effort is normal.     Breath sounds: Normal breath sounds.  Chest:     Breasts:        Right: Normal.        Left: Tenderness present.     Comments: TTP along superior outer quadrant of left breast. Fibrocystic tissue. No rash/lesions Abdominal:     General: Abdomen is flat. Bowel sounds are normal. There is no distension.     Palpations: Abdomen is soft.     Tenderness: There is no abdominal tenderness.  Musculoskeletal:     Cervical back: Normal range of motion and neck supple.  Lymphadenopathy:     Cervical: No cervical adenopathy.     Upper Body:     Left upper body: No axillary adenopathy.  Skin:    General: Skin is warm and dry.     Capillary Refill: Capillary refill takes less than 2 seconds.     Findings: No rash.  Neurological:     General: No focal deficit present.     Mental Status: She is alert and oriented to person, place, and time.     Cranial Nerves: No cranial nerve deficit.     Coordination: Coordination normal.     Deep Tendon Reflexes: Reflexes normal.  Psychiatric:        Mood and Affect: Mood normal.        Behavior: Behavior normal.        Assessment/plan: 1. Annual physical exam Routine lab work today. She is not fasting. Hm reviewed. Referral to cscope done today. Discussed shingles vaccine would wait until 54 years of age. ? If about to have shingles outbreak. Breast exam normal. Valtrex px sent in to have for her. mmg scheduled. Continue healthy diet and lifestyle. F/u in one year or as needed.  Patient counseling [x]    Nutrition: Stressed importance of moderation in sodium/caffeine intake, saturated fat and cholesterol, caloric balance, sufficient intake of fresh fruits, vegetables, fiber, calcium, iron, and 1 mg of folate supplement per day (for females capable of pregnancy).  [x]    Stressed the importance of regular exercise.   []     Substance Abuse: Discussed cessation/primary prevention of tobacco, alcohol, or other drug use; driving or other dangerous activities under the influence; availability of treatment for abuse.   [x]    Injury prevention: Discussed safety belts, safety helmets, smoke detector, smoking near bedding or upholstery.   [x]    Sexuality: Discussed sexually transmitted diseases, partner selection, use of condoms, avoidance of unintended pregnancy  and contraceptive alternatives.  [x]    Dental health: Discussed importance of regular tooth brushing, flossing, and dental visits.  [x]    Health maintenance and immunizations reviewed. Please refer to Health maintenance section.    2. Need for immunization against influenza  - Flu Vaccine QUAD 36+ mos IM - CBC with Differential/Platelet; Future - Lipid panel; Future - TSH; Future - COMPLETE METABOLIC PANEL WITH GFR; Future  3. Encounter for hepatitis C screening test for low risk patient  - Hepatitis C antibody; Future  4. Encounter for screening colonoscopy  -  Ambulatory referral to Gastroenterology    This visit occurred during the SARS-CoV-2 public health emergency.  Safety protocols were in place, including screening questions prior to the visit, additional usage of staff PPE, and extensive cleaning of exam room while observing appropriate contact time as indicated for disinfecting solutions.     Return in about 1 year (around 09/26/2021) for annual .     Orland Mustard, MD [facility]  09/26/2020

## 2020-09-26 NOTE — Patient Instructions (Signed)
-valtrex sent in for possible shingles if rash breaks out on breast -labs today -flu shot today.   Preventive Care 54-54 Years Old, Female Preventive care refers to visits with your health care provider and lifestyle choices that can promote health and wellness. This includes:  A yearly physical exam. This may also be called an annual well check.  Regular dental visits and eye exams.  Immunizations.  Screening for certain conditions.  Healthy lifestyle choices, such as eating a healthy diet, getting regular exercise, not using drugs or products that contain nicotine and tobacco, and limiting alcohol use. What can I expect for my preventive care visit? Physical exam Your health care provider will check your:  Height and weight. This may be used to calculate body mass index (BMI), which tells if you are at a healthy weight.  Heart rate and blood pressure.  Skin for abnormal spots. Counseling Your health care provider may ask you questions about your:  Alcohol, tobacco, and drug use.  Emotional well-being.  Home and relationship well-being.  Sexual activity.  Eating habits.  Work and work Statistician.  Method of birth control.  Menstrual cycle.  Pregnancy history. What immunizations do I need?  Influenza (flu) vaccine  This is recommended every year. Tetanus, diphtheria, and pertussis (Tdap) vaccine  You may need a Td booster every 10 years. Varicella (chickenpox) vaccine  You may need this if you have not been vaccinated. Zoster (shingles) vaccine  You may need this after age 35. Measles, mumps, and rubella (MMR) vaccine  You may need at least one dose of MMR if you were born in 1957 or later. You may also need a second dose. Pneumococcal conjugate (PCV13) vaccine  You may need this if you have certain conditions and were not previously vaccinated. Pneumococcal polysaccharide (PPSV23) vaccine  You may need one or two doses if you smoke cigarettes or if  you have certain conditions. Meningococcal conjugate (MenACWY) vaccine  You may need this if you have certain conditions. Hepatitis A vaccine  You may need this if you have certain conditions or if you travel or work in places where you may be exposed to hepatitis A. Hepatitis B vaccine  You may need this if you have certain conditions or if you travel or work in places where you may be exposed to hepatitis B. Haemophilus influenzae type b (Hib) vaccine  You may need this if you have certain conditions. Human papillomavirus (HPV) vaccine  If recommended by your health care provider, you may need three doses over 6 months. You may receive vaccines as individual doses or as more than one vaccine together in one shot (combination vaccines). Talk with your health care provider about the risks and benefits of combination vaccines. What tests do I need? Blood tests  Lipid and cholesterol levels. These may be checked every 5 years, or more frequently if you are over 67 years old.  Hepatitis C test.  Hepatitis B test. Screening  Lung cancer screening. You may have this screening every year starting at age 64 if you have a 30-pack-year history of smoking and currently smoke or have quit within the past 15 years.  Colorectal cancer screening. All adults should have this screening starting at age 76 and continuing until age 17. Your health care provider may recommend screening at age 87 if you are at increased risk. You will have tests every 1-10 years, depending on your results and the type of screening test.  Diabetes screening. This is done  by checking your blood sugar (glucose) after you have not eaten for a while (fasting). You may have this done every 1-3 years.  Mammogram. This may be done every 1-2 years. Talk with your health care provider about when you should start having regular mammograms. This may depend on whether you have a family history of breast cancer.  BRCA-related cancer  screening. This may be done if you have a family history of breast, ovarian, tubal, or peritoneal cancers.  Pelvic exam and Pap test. This may be done every 3 years starting at age 31. Starting at age 51, this may be done every 5 years if you have a Pap test in combination with an HPV test. Other tests  Sexually transmitted disease (STD) testing.  Bone density scan. This is done to screen for osteoporosis. You may have this scan if you are at high risk for osteoporosis. Follow these instructions at home: Eating and drinking  Eat a diet that includes fresh fruits and vegetables, whole grains, lean protein, and low-fat dairy.  Take vitamin and mineral supplements as recommended by your health care provider.  Do not drink alcohol if: ? Your health care provider tells you not to drink. ? You are pregnant, may be pregnant, or are planning to become pregnant.  If you drink alcohol: ? Limit how much you have to 0-1 drink a day. ? Be aware of how much alcohol is in your drink. In the [place] one drink equals one 12 oz bottle of beer (355 mL), one 5 oz glass of wine (148 mL), or one 1 oz glass of hard liquor (44 mL). Lifestyle  Take daily care of your teeth and gums.  Stay active. Exercise for at least 30 minutes on 5 or more days each week.  Do not use any products that contain nicotine or tobacco, such as cigarettes, e-cigarettes, and chewing tobacco. If you need help quitting, ask your health care provider.  If you are sexually active, practice safe sex. Use a condom or other form of birth control (contraception) in order to prevent pregnancy and STIs (sexually transmitted infections).  If told by your health care provider, take low-dose aspirin daily starting at age 36. What's next?  Visit your health care provider once a year for a well check visit.  Ask your health care provider how often you should have your eyes and teeth checked.  Stay up to date on all vaccines. This  information is not intended to replace advice given to you by your health care provider. Make sure you discuss any questions you have with your health care provider. Document Revised: 07/06/2018 Document Reviewed: 07/06/2018 Elsevier Patient Education  2020 [employer].

## 2020-09-29 LAB — LIPID PANEL
Cholesterol: 167 mg/dL (ref <200)
HDL: 60 mg/dL (ref >=50)
LDL Cholesterol (Calc): 91 mg/dL (calc)
Non-HDL Cholesterol (Calc): 107 mg/dL (calc) (ref <130)
Total CHOL/HDL Ratio: 2.8 (calc) (ref <5.0)
Triglycerides: 70 mg/dL (ref <150)

## 2020-09-29 LAB — CBC WITH DIFFERENTIAL/PLATELET
Absolute Monocytes: 578 cells/uL (ref 200–950)
Basophils Absolute: 53 cells/uL (ref 0–200)
Basophils Relative: 0.7 %
Eosinophils Absolute: 120 cells/uL (ref 15–500)
Eosinophils Relative: 1.6 %
HCT: 39.9 % (ref 35.0–45.0)
Hemoglobin: 13 g/dL (ref 11.7–15.5)
Lymphs Abs: 1530 cells/uL (ref 850–3900)
MCH: 30.2 pg (ref 27.0–33.0)
MCHC: 32.6 g/dL (ref 32.0–36.0)
MCV: 92.6 fL (ref 80.0–100.0)
MPV: 12.9 fL — ABNORMAL HIGH (ref 7.5–12.5)
Monocytes Relative: 7.7 %
Neutro Abs: 5220 cells/uL (ref 1500–7800)
Neutrophils Relative %: 69.6 %
Platelets: 192 10*3/uL (ref 140–400)
RBC: 4.31 10*6/uL (ref 3.80–5.10)
RDW: 12.6 % (ref 11.0–15.0)
Total Lymphocyte: 20.4 %
WBC: 7.5 10*3/uL (ref 3.8–10.8)

## 2020-09-29 LAB — COMPLETE METABOLIC PANEL WITH GFR
AG Ratio: 1.9 (calc) (ref 1.0–2.5)
ALT: 12 U/L (ref 6–29)
AST: 22 U/L (ref 10–35)
Albumin: 4.2 g/dL (ref 3.6–5.1)
Alkaline phosphatase (APISO): 41 U/L (ref 37–153)
BUN: 12 mg/dL (ref 7–25)
CO2: 25 mmol/L (ref 20–32)
Calcium: 9.4 mg/dL (ref 8.6–10.4)
Chloride: 100 mmol/L (ref 98–110)
Creat: 0.52 mg/dL (ref 0.50–1.05)
GFR, Est African American: 126 mL/min/{1.73_m2} (ref >=60)
GFR, Est Non African American: 109 mL/min/{1.73_m2} (ref >=60)
Globulin: 2.2 g/dL (calc) (ref 1.9–3.7)
Glucose, Bld: 74 mg/dL (ref 65–99)
Potassium: 3.8 mmol/L (ref 3.5–5.3)
Sodium: 137 mmol/L (ref 135–146)
Total Bilirubin: 0.5 mg/dL (ref 0.2–1.2)
Total Protein: 6.4 g/dL (ref 6.1–8.1)

## 2020-09-29 LAB — HEPATITIS C ANTIBODY
Hepatitis C Ab: NONREACTIVE
SIGNAL TO CUT-OFF: 0 (ref <1.00)

## 2020-09-29 LAB — TSH: TSH: 1.55 mIU/L

## 2020-10-21 ENCOUNTER — Ambulatory Visit
Admission: RE | Admit: 2020-10-21 | Discharge: 2020-10-21 | Disposition: A | Discharge status: Home / Self Care | Payer: BC Managed Care – PPO | Source: Ambulatory Visit | Attending: Family Medicine | Admitting: Family Medicine

## 2020-10-21 ENCOUNTER — Other Ambulatory Visit: Payer: Self-pay | Admitting: Family Medicine

## 2020-10-21 ENCOUNTER — Other Ambulatory Visit: Payer: Self-pay

## 2020-10-21 DIAGNOSIS — Z1231 Encounter for screening mammogram for malignant neoplasm of breast: Secondary | ICD-10-CM

## 2020-10-21 DIAGNOSIS — R234 Changes in skin texture: Secondary | ICD-10-CM

## 2020-10-23 ENCOUNTER — Encounter: Payer: Self-pay | Admitting: Family Medicine

## 2020-11-03 ENCOUNTER — Encounter: Payer: Self-pay | Admitting: Family Medicine

## 2020-12-03 ENCOUNTER — Ambulatory Visit
Admission: RE | Admit: 2020-12-03 | Discharge: 2020-12-03 | Disposition: A | Discharge status: Home / Self Care | Payer: BC Managed Care – PPO | Source: Ambulatory Visit | Attending: Family Medicine | Admitting: Family Medicine

## 2020-12-03 ENCOUNTER — Ambulatory Visit (AMBULATORY_SURGERY_CENTER): Payer: Self-pay

## 2020-12-03 ENCOUNTER — Other Ambulatory Visit: Payer: Self-pay

## 2020-12-03 VITALS — Ht 68.0 in | Wt 144.0 lb

## 2020-12-03 DIAGNOSIS — R234 Changes in skin texture: Secondary | ICD-10-CM

## 2020-12-03 DIAGNOSIS — Z1211 Encounter for screening for malignant neoplasm of colon: Secondary | ICD-10-CM

## 2020-12-03 NOTE — Progress Notes (Signed)
No allergies to soy or egg Pt is not on blood thinners or diet pills Denies issues with sedation/intubation Denies atrial flutter/fib Denies constipation    Pt is aware of Covid safety and care partner requirements.   Cardiac syncope-has not had in several years last time remembered was in 2019.  Happens, it was determined, if she dehydrated.  Was evaluated each time by Dr. Sheppard Penton and other PCPs.    Had stress test, Echo and ECG-with all being negative per patient.   Stressed the hydration instructions in her prep instructions.

## 2020-12-04 ENCOUNTER — Encounter: Payer: Self-pay | Admitting: Gastroenterology

## 2020-12-11 ENCOUNTER — Other Ambulatory Visit: Payer: Self-pay | Admitting: Obstetrics and Gynecology

## 2020-12-11 DIAGNOSIS — Z01419 Encounter for gynecological examination (general) (routine) without abnormal findings: Secondary | ICD-10-CM

## 2020-12-11 NOTE — Telephone Encounter (Signed)
Last AEX 10/14/2019. Scheduled AEX for 03/04/2021.

## 2020-12-17 ENCOUNTER — Other Ambulatory Visit: Payer: Self-pay

## 2020-12-17 ENCOUNTER — Ambulatory Visit (AMBULATORY_SURGERY_CENTER): Payer: BC Managed Care – PPO | Admitting: Gastroenterology

## 2020-12-17 ENCOUNTER — Encounter: Payer: Self-pay | Admitting: Gastroenterology

## 2020-12-17 VITALS — BP 106/62 | HR 64 | Temp 97.0°F | Resp 24 | Ht 68.0 in | Wt 144.0 lb

## 2020-12-17 DIAGNOSIS — Z1211 Encounter for screening for malignant neoplasm of colon: Secondary | ICD-10-CM | POA: Diagnosis present

## 2020-12-17 MED ORDER — SODIUM CHLORIDE 0.9 % IV SOLN: 500.0000 mL | Freq: Once | INTRAVENOUS | Status: DC

## 2020-12-17 NOTE — Op Note (Signed)
[facility] Patient Name: Natasha Howell Procedure Date: 12/17/2020 10:10 AM MRN: [id number] Endoscopist: Sherilyn Cooter L. Myrtie Neither , MD Age: 55 Referring MD:  Date of Birth: [birth year] Gender: Female Account #: [id number] Procedure:                Colonoscopy Indications:              Screening for colorectal malignant neoplasm, This                            is the patient's first colonoscopy Medicines:                Monitored Anesthesia Care Procedure:                Pre-Anesthesia Assessment:                           - Prior to the procedure, a History and Physical                            was performed, and patient medications and                            allergies were reviewed. The patient's tolerance of                            previous anesthesia was also reviewed. The risks                            and benefits of the procedure and the sedation                            options and risks were discussed with the patient.                            All questions were answered, and informed consent                            was obtained. Prior Anticoagulants: The patient has                            taken no previous anticoagulant or antiplatelet                            agents. ASA Grade Assessment: III - A patient with                            severe systemic disease. After reviewing the risks                            and benefits, the patient was deemed in                            satisfactory condition to undergo the procedure.  After obtaining informed consent, the colonoscope                            was passed under direct vision. Throughout the                            procedure, the patient's blood pressure, pulse, and                            oxygen saturations were monitored continuously. The                            Olympus PCF-H190DL (#[id number]) Colonoscope was                            introduced through the anus  and advanced to the the                            cecum, identified by appendiceal orifice and                            ileocecal valve. The colonoscopy was somewhat                            difficult due to a redundant colon, significant                            looping and a tortuous colon. Successful completion                            of the procedure was aided by changing the patient                            to a supine position and using manual pressure. The                            patient tolerated the procedure well. The quality                            of the bowel preparation was good after lavage. The                            ileocecal valve, appendiceal orifice, and rectum                            were photographed. The bowel preparation used was                            Miralax. (patient vomited some of evening and AM                            prep) Scope In: 10:33:14 AM Scope Out: 10:51:54 AM Scope Withdrawal Time: 0 hours 13 minutes 40 seconds  Total Procedure Duration: 0 hours 18 minutes 40 seconds  Findings:                 The perianal and digital rectal examinations were                            normal.                           The colon (entire examined portion) was redundant.                           There is no endoscopic evidence of polyps in the                            entire colon.                           The exam was otherwise without abnormality on                            direct and retroflexion views. Complications:            No immediate complications. Estimated Blood Loss:     Estimated blood loss: none. Impression:               - Redundant colon.                           - The examination was otherwise normal on direct                            and retroflexion views.                           - No specimens collected. Recommendation:           - Patient has a contact number available for                             emergencies. The signs and symptoms of potential                            delayed complications were discussed with the                            patient. Return to normal activities tomorrow.                            Written discharge instructions were provided to the                            patient.                           - Resume previous diet.                           -  Continue present medications.                           - Repeat colonoscopy in 10 years for screening                            purposes. Alternate prep and anti-emetics for next                            colonoscopy (see above) Conor Lata L. Myrtie Neither, MD 12/17/2020 10:56:35 AM This report has been signed electronically.

## 2020-12-17 NOTE — Progress Notes (Signed)
VS by CW  I have reviewed the patient's medical history in detail and updated the computerized patient record.  

## 2020-12-17 NOTE — Progress Notes (Signed)
Pt Drowsy. VSS. To PACU, report to RN. No anesthetic complications noted.  

## 2020-12-17 NOTE — Patient Instructions (Signed)
Discharge instructions given. Normal exam. Resume previous medications. YOU HAD AN ENDOSCOPIC PROCEDURE TODAY AT THE [facility]:   Refer to the procedure report that was given to you for any specific questions about what was found during the examination.  If the procedure report does not answer your questions, please call your gastroenterologist to clarify.  If you requested that your care partner not be given the details of your procedure findings, then the procedure report has been included in a sealed envelope for you to review at your convenience later.  YOU SHOULD EXPECT: Some feelings of bloating in the abdomen. Passage of more gas than usual.  Walking can help get rid of the air that was put into your GI tract during the procedure and reduce the bloating. If you had a lower endoscopy (such as a colonoscopy or flexible sigmoidoscopy) you may notice spotting of blood in your stool or on the toilet paper. If you underwent a bowel prep for your procedure, you may not have a normal bowel movement for a few days.  Please Note:  You might notice some irritation and congestion in your nose or some drainage.  This is from the oxygen used during your procedure.  There is no need for concern and it should clear up in a day or so.  SYMPTOMS TO REPORT IMMEDIATELY:  Following lower endoscopy (colonoscopy or flexible sigmoidoscopy):  Excessive amounts of blood in the stool  Significant tenderness or worsening of abdominal pains  Swelling of the abdomen that is new, acute  Fever of 100F or higher   For urgent or emergent issues, a gastroenterologist can be reached at any hour by calling [phone]. Do not use MyChart messaging for urgent concerns.    DIET:  We do recommend a small meal at first, but then you may proceed to your regular diet.  Drink plenty of fluids but you should avoid alcoholic beverages for 24 hours.  ACTIVITY:  You should plan to take it easy for the rest of  today and you should NOT DRIVE or use heavy machinery until tomorrow (because of the sedation medicines used during the test).    FOLLOW UP: Our staff will call the number listed on your records 48-72 hours following your procedure to check on you and address any questions or concerns that you may have regarding the information given to you following your procedure. If we do not reach you, we will leave a message.  We will attempt to reach you two times.  During this call, we will ask if you have developed any symptoms of COVID 19. If you develop any symptoms (ie: fever, flu-like symptoms, shortness of breath, cough etc.) before then, please call [phone].  If you test positive for Covid 19 in the 2 weeks post procedure, please call and report this information to us.    If any biopsies were taken you will be contacted by phone or by letter within the next 1-3 weeks.  Please call us at [phone] if you have not heard about the biopsies in 3 weeks.    SIGNATURES/CONFIDENTIALITY: You and/or your care partner have signed paperwork which will be entered into your electronic medical record.  These signatures attest to the fact that that the information above on your After Visit Summary has been reviewed and is understood.  Full responsibility of the confidentiality of this discharge information lies with you and/or your care-partner.  

## 2020-12-19 ENCOUNTER — Telehealth: Payer: Self-pay | Admitting: *Deleted

## 2020-12-19 ENCOUNTER — Telehealth: Payer: Self-pay

## 2020-12-19 NOTE — Telephone Encounter (Signed)
First attempt follow up call to pt, lm on vm 

## 2020-12-19 NOTE — Telephone Encounter (Signed)
  Follow up Call-  Call back number 12/17/2020  Post procedure Call Back phone  # [phone]  Permission to leave phone message Yes  Some recent data might be hidden     Patient questions:  Do you have a fever, pain , or abdominal swelling? No. Pain Score  0 *  Have you tolerated food without any problems? Yes.    Have you been able to return to your normal activities? Yes.    Do you have any questions about your discharge instructions: Diet   No. Medications  No. Follow up visit  No.  Do you have questions or concerns about your Care? No.  Actions: * If pain score is 4 or above: No action needed, pain <4.  1. Have you developed a fever since your procedure? no  2.   Have you had an respiratory symptoms (SOB or cough) since your procedure? no  3.   Have you tested positive for COVID 19 since your procedure no  4.   Have you had any family members/close contacts diagnosed with the COVID 19 since your procedure?  no   If yes to any of these questions please route to Laverna Peace, RN and Karlton Lemon, RN

## 2021-02-21 ENCOUNTER — Ambulatory Visit
Admission: EM | Admit: 2021-02-21 | Discharge: 2021-02-21 | Disposition: A | Discharge status: Home / Self Care | Payer: BC Managed Care – PPO | Attending: Physician Assistant | Admitting: Physician Assistant

## 2021-02-21 ENCOUNTER — Encounter: Payer: Self-pay | Admitting: Emergency Medicine

## 2021-02-21 ENCOUNTER — Other Ambulatory Visit: Payer: Self-pay

## 2021-02-21 DIAGNOSIS — R0981 Nasal congestion: Secondary | ICD-10-CM | POA: Diagnosis not present

## 2021-02-21 DIAGNOSIS — J019 Acute sinusitis, unspecified: Secondary | ICD-10-CM | POA: Diagnosis not present

## 2021-02-21 MED ORDER — FLUTICASONE PROPIONATE 50 MCG/ACT NA SUSP: 2.0000 | Freq: Every day | NASAL | 0 refills | Status: DC

## 2021-02-21 MED ORDER — AMOXICILLIN-POT CLAVULANATE 875-125 MG PO TABS: 1.0000 | ORAL_TABLET | Freq: Two times a day (BID) | ORAL | 0 refills | Status: AC

## 2021-02-21 NOTE — Discharge Instructions (Signed)
Continue oral decongestants and start using Flonase.  I have sent Augmentin since her symptoms do seem consistent with a sinus infection.  If you have a fever, worsening sinus pain, breathing difficulty or fatigue please return for reexamination.

## 2021-02-21 NOTE — ED Triage Notes (Addendum)
Patient states she had congestion last week, she states now she has facial pain and pressure.

## 2021-02-21 NOTE — ED Provider Notes (Signed)
[facility]    CSN: [id number] Arrival date & time: 02/21/21  0805      History   Chief Complaint Chief Complaint  Patient presents with  . Nasal Congestion  . Facial Pain    HPI Natasha Howell is a 55 y.o. female presenting for 1.5-week history of nasal congestion.  Patient states that last week her symptoms were cold-like and she had a lot of postnasal drainage and congestion as well as mild headaches.  She states that she felt better for couple of days and then yesterday she started to have intense right-sided facial pain and pressure with pain into her teeth and jaw.  She denies any fever.  She does admit to some fatigue.  Nasal drainage is yellowish.  She has not had a cough, chest discomfort or breathing difficulty.  No sick contacts and no known exposure to COVID-19.  Has taken multiple at home COVID test which were negative.  Has been taking over-the-counter Synex and ibuprofen and states the ibuprofen helps the headache slightly.  Patient states she has had a sinus infection in the past and she believes her symptoms are consistent with a sinus infection.  She has no other concerns.  HPI  Past Medical History:  Diagnosis Date  . Allergy   . Anxiety   . GERD (gastroesophageal reflux disease)   . Heart murmur    Heard on occasion-is not under treatment  . History of chicken pox   . History of fainting spells of unknown cause   . History of recurrent UTIs   . Vitiligo     Patient Active Problem List   Diagnosis Date Noted  . Vitiligo 08/25/2018  . Situational anxiety 08/25/2018  . Cardiac syncope 11/25/2017  . Palpitations 11/25/2017    Past Surgical History:  Procedure Laterality Date  . ABDOMINAL HYSTERECTOMY  2001  . WISDOM TOOTH EXTRACTION      OB History    Gravida  2   Para  2   Term  2   Preterm      AB      Living  2     SAB      IAB      Ectopic      Multiple      Live Births  2            Home Medications     Prior to Admission medications   Medication Sig Start Date End Date Taking? Authorizing Provider  amoxicillin-clavulanate (AUGMENTIN) 875-125 MG tablet Take 1 tablet by mouth every 12 (twelve) hours for 7 days. 02/21/21 02/28/21 Yes Shirlee Latch, PA-C  clonazePAM (KLONOPIN) 0.5 MG tablet Take 1/2 tablet as needed for travel 01/17/20  Yes Orland Mustard, MD  estradiol (VIVELLE-DOT) 0.05 MG/24HR patch PLACE 1 PATCH (0.05 MG TOTAL) ONTO THE SKIN 2 (TWO) TIMES A WEEK. 12/11/20  Yes Romualdo Bolk, MD  fluticasone [facility]) 50 MCG/ACT nasal spray Place 2 sprays into both nostrils daily for 15 days. 02/21/21 03/08/21 Yes Eusebio Friendly B, PA-C  Ibuprofen (ADVIL PO) Take by mouth.   Yes [provider]  Lactobacillus (PROBIOTIC ACIDOPHILUS PO) Take by mouth.   Yes [provider]  Turmeric (QC TUMERIC COMPLEX PO) Take by mouth.   Yes [provider]  valACYclovir (VALTREX) 1000 MG tablet Take 1 tablet (1,000 mg total) by mouth 3 (three) times daily. 09/26/20  Yes Orland Mustard, MD    Family History Family History  Problem Relation  Age of Onset  . Hypertension Mother   . Arthritis Mother   . Depression Mother   . Hyperlipidemia Mother   . Diabetes Sister   . Mental illness Sister   . Depression Brother   . Anxiety disorder Brother   . Breast cancer Maternal Aunt   . Colon cancer Neg Hx   . Colon polyps Neg Hx   . Esophageal cancer Neg Hx   . Rectal cancer Neg Hx   . Stomach cancer Neg Hx     Social History Social History   Tobacco Use  . Smoking status: Never Smoker  . Smokeless tobacco: Never Used  Vaping Use  . Vaping Use: Never used  Substance Use Topics  . Alcohol use: Yes    Alcohol/week: 7.0 standard drinks    Types: 7 Glasses of wine per week  . Drug use: No     Allergies   Morphine and related   Review of Systems Review of Systems  Constitutional: Positive for fatigue. Negative for chills, diaphoresis and fever.  HENT: Positive for  congestion, rhinorrhea, sinus pressure and sinus pain. Negative for ear pain and sore throat.   Respiratory: Negative for cough and shortness of breath.   Gastrointestinal: Negative for abdominal pain, nausea and vomiting.  Musculoskeletal: Negative for arthralgias and myalgias.  Skin: Negative for rash.  Neurological: Positive for headaches. Negative for weakness.  Hematological: Negative for adenopathy.     Physical Exam Triage Vital Signs ED Triage Vitals [02/21/21 0816]  Enc Vitals Group     BP      Pulse      Resp      Temp      Temp src      SpO2      Weight 143 lb 15.4 oz (65.3 kg)     Height 5\' 8"  (1.727 m)     Head Circumference      Peak Flow      Pain Score 8     Pain Loc      Pain Edu?      Excl. in GC?    No data found.  Updated Vital Signs BP (!) 102/49 (BP Location: Right Arm)   Pulse (!) 56   Temp 97.9 F (36.6 C) (Oral)   Resp 18   Ht 5\' 8"  (1.727 m)   Wt 143 lb 15.4 oz (65.3 kg)   LMP  (LMP Unknown)   SpO2 98%   BMI 21.89 kg/m    Physical Exam Vitals and nursing note reviewed.  Constitutional:      General: She is not in acute distress.    Appearance: Normal appearance. She is not ill-appearing or toxic-appearing.  HENT:     Head: Normocephalic and atraumatic.     Right Ear: Tympanic membrane, ear canal and external ear normal.     Left Ear: Tympanic membrane, ear canal and external ear normal.     Nose: Congestion and rhinorrhea (thick yellowish drainage) present.     Mouth/Throat:     Mouth: Mucous membranes are moist.     Pharynx: Oropharynx is clear. Posterior oropharyngeal erythema (with large amount of PND) present.  Eyes:     General: No scleral icterus.       Right eye: No discharge.        Left eye: No discharge.     Conjunctiva/sclera: Conjunctivae normal.  Cardiovascular:     Rate and Rhythm: Regular rhythm. Bradycardia present.     Heart sounds: Normal  heart sounds.  Pulmonary:     Effort: Pulmonary effort is normal. No  respiratory distress.     Breath sounds: Normal breath sounds. No wheezing, rhonchi or rales.  Musculoskeletal:     Cervical back: Neck supple.  Skin:    General: Skin is dry.  Neurological:     General: No focal deficit present.     Mental Status: She is alert. Mental status is at baseline.     Motor: No weakness.     Gait: Gait normal.  Psychiatric:        Mood and Affect: Mood normal.        Behavior: Behavior normal.        Thought Content: Thought content normal.      UC Treatments / Results  Labs (all labs ordered are listed, but only abnormal results are displayed) Labs Reviewed - No data to display  EKG   Radiology No results found.  Procedures Procedures (including critical care time)  Medications Ordered in UC Medications - No data to display  Initial Impression / Assessment and Plan / UC Course  I have reviewed the triage vital signs and the nursing notes.  Pertinent labs & imaging results that were available during my care of the patient were reviewed by me and considered in my medical decision making (see chart for details).   26 old female presenting for 1.5-week history of nasal congestion with new onset intense right-sided facial pain and pressure yesterday.  Symptoms clinical presentation consistent with bacterial sinusitis.  Treating at this time with Augmentin and Flonase.  Also encouraged her to continue with Sudafed and Mucinex and increasing rest and fluids.  Advised her to follow-up with our department as needed for any worsening symptoms or if not better after completing medication.  Final Clinical Impressions(s) / UC Diagnoses   Final diagnoses:  Acute sinusitis, recurrence not specified, unspecified location  Nasal congestion     Discharge Instructions     Continue oral decongestants and start using Flonase.  I have sent Augmentin since her symptoms do seem consistent with a sinus infection.  If you have a fever, worsening sinus pain,  breathing difficulty or fatigue please return for reexamination.    ED Prescriptions    Medication Sig Dispense Auth. Provider   amoxicillin-clavulanate (AUGMENTIN) 875-125 MG tablet Take 1 tablet by mouth every 12 (twelve) hours for 7 days. 14 tablet Eusebio Friendly B, PA-C   fluticasone (FLONASE) 50 MCG/ACT nasal spray Place 2 sprays into both nostrils daily for 15 days. 1 g Shirlee Latch, PA-C     PDMP not reviewed this encounter.   Shirlee Latch, PA-C 02/21/21 [phone]

## 2021-02-25 ENCOUNTER — Other Ambulatory Visit: Payer: Self-pay | Admitting: Family Medicine

## 2021-02-25 ENCOUNTER — Encounter: Payer: Self-pay | Admitting: Family Medicine

## 2021-02-25 MED ORDER — PREDNISONE 20 MG PO TABS: 40.0000 mg | ORAL_TABLET | Freq: Every day | ORAL | 0 refills | Status: DC

## 2021-02-25 NOTE — Progress Notes (Signed)
pr

## 2021-03-02 NOTE — Progress Notes (Signed)
55 y.o. G83P2002 Married White or Caucasian Not Hispanic or Latino female here for annual exam. H/O vaginal hysterectomy and unilateral salpingo-oophorectomy in her mid 23's for a dermoid cyst.  She was started on ERT in 11/20 for vasomotor symptoms. Doing well on ERT, only occasional vasomotor symptoms. Sexually active, without pain.   She c/o getting bloated easily. Comes and go. Present more than not. Normal BM qd. Helped with gas-x.   No urinary c/o.     No LMP recorded (lmp unknown). Patient has had a hysterectomy.          Sexually active: Yes.    The current method of family planning is status post hysterectomy.    Smoker:  no  Health Maintenance: Pap:  04/03/15 WNL Neg HPV  History of abnormal Pap:  Yes, repeat was normal MMG:  12/03/20 [place] left breast density C Bi-rads 1 neg  BMD:   None  Colonoscopy: 12/17/20 normal follow up 10 years  TDaP:  12/12/2013  Gardasil: NA   reports that she has never smoked. She has never used smokeless tobacco. She reports current alcohol use of about 7.0 standard drinks of alcohol per week. She reports that she does not use drugs. 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 Working at [employer]. 2 grown children, 30 and 23. Oldest is a girl, married, no children. Son is works for a [employer].   Past Medical History:  Diagnosis Date  . Allergy   . Anxiety   . GERD (gastroesophageal reflux disease)   . Heart murmur    Heard on occasion-is not under treatment  . History of chicken pox   . History of fainting spells of unknown cause   . History of recurrent UTIs   . Vitiligo     Past Surgical History:  Procedure Laterality Date  . TOTAL VAGINAL HYSTERECTOMY  2001   with unilateral salpingo-oophorectomy  . WISDOM TOOTH EXTRACTION      Current Outpatient Medications  Medication Sig Dispense Refill  . clonazePAM (KLONOPIN) 0.5 MG tablet TAKE 1/2 TABLET AS NEEDED FOR  TRAVEL 20 tablet 0  . estradiol (VIVELLE-DOT) 0.05 MG/24HR patch PLACE 1 PATCH (0.05 MG TOTAL) ONTO THE SKIN 2 (TWO) TIMES A WEEK. 24 patch 0  . fluticasone (FLONASE) 50 MCG/ACT nasal spray Place 2 sprays into both nostrils daily for 15 days. 1 g 0  . Ibuprofen (ADVIL PO) Take by mouth.    . Lactobacillus (PROBIOTIC ACIDOPHILUS PO) Take by mouth.    . Turmeric (QC TUMERIC COMPLEX PO) Take by mouth.     No current facility-administered medications for this visit.  Valtrex was for possible shingles, never took it.   Family History  Problem Relation Age of Onset  . Hypertension Mother   . Arthritis Mother   . Depression Mother   . Hyperlipidemia Mother   . Diabetes Sister   . Mental illness Sister   . Depression Brother   . Anxiety disorder Brother   . Breast cancer Maternal Aunt   . Colon cancer Neg Hx   . Colon polyps Neg Hx   . Esophageal cancer Neg Hx   . Rectal cancer Neg Hx   . Stomach cancer Neg Hx     Review of Systems  Gastrointestinal:       Bloating    Exam:   BP 110/64   Pulse 68   Ht 5\' 8"  (1.727 m)   Wt  144 lb (65.3 kg)   LMP  (LMP Unknown)   SpO2 100%   BMI 21.90 kg/m   Weight change: @WEIGHTCHANGE @ Height:   Height: 5\' 8"  (172.7 cm)  Ht Readings from Last 3 Encounters:  03/04/21 5\' 8"  (1.727 m)  02/21/21 5\' 8"  (1.727 m)  12/17/20 5\' 8"  (1.727 m)    General appearance: alert, cooperative and appears stated age Head: Normocephalic, without obvious abnormality, atraumatic Neck: no adenopathy, supple, symmetrical, trachea midline and thyroid normal to inspection and palpation Lungs: clear to auscultation bilaterally Cardiovascular: regular rate and rhythm Breasts: normal appearance, no masses or tenderness Abdomen: soft, non-tender; non distended,  no masses,  no organomegaly Extremities: extremities normal, atraumatic, no cyanosis or edema Skin: Skin color, texture, turgor normal. No rashes or lesions Lymph nodes: Cervical, supraclavicular, and  axillary nodes normal. No abnormal inguinal nodes palpated Neurologic: Grossly normal   Pelvic: External genitalia:  no lesions              Urethra:  normal appearing urethra with no masses, tenderness or lesions              Bartholins and Skenes: normal                 Vagina: normal appearing vagina with normal color and discharge, no lesions              Cervix: absent               Bimanual Exam:  Uterus:  uterus absent              Adnexa: no mass, fullness, tenderness               Rectovaginal: Confirms               Anus:  normal sphincter tone, no lesions   1. Well woman exam No pap  Discussed breast self exam Discussed calcium and vit D intake Colonoscopy and mammogram are UTD   2. Encounter for monitoring postmenopausal estrogen replacement therapy Doing well, wants to continue. Aware of risks. - estradiol (VIVELLE-DOT) 0.05 MG/24HR patch; Place 1 patch (0.05 mg total) onto the skin 2 (two) times a week.  Dispense: 24 patch; Refill: 3

## 2021-03-03 IMAGING — MG MM DIGITAL DIAGNOSTIC UNILAT*L* W/ TOMO W/ CAD
4 series · 4 of 12 positions shown · non-contrast
Comparison: Previous exam(s).

CLINICAL DATA: Possible asymmetry in the inferior left breast in
the oblique projection of a recent screening mammogram.

EXAM:
DIGITAL DIAGNOSTIC UNILATERAL LEFT MAMMOGRAM WITH CAD AND TOMO

[L ML synth-2D]
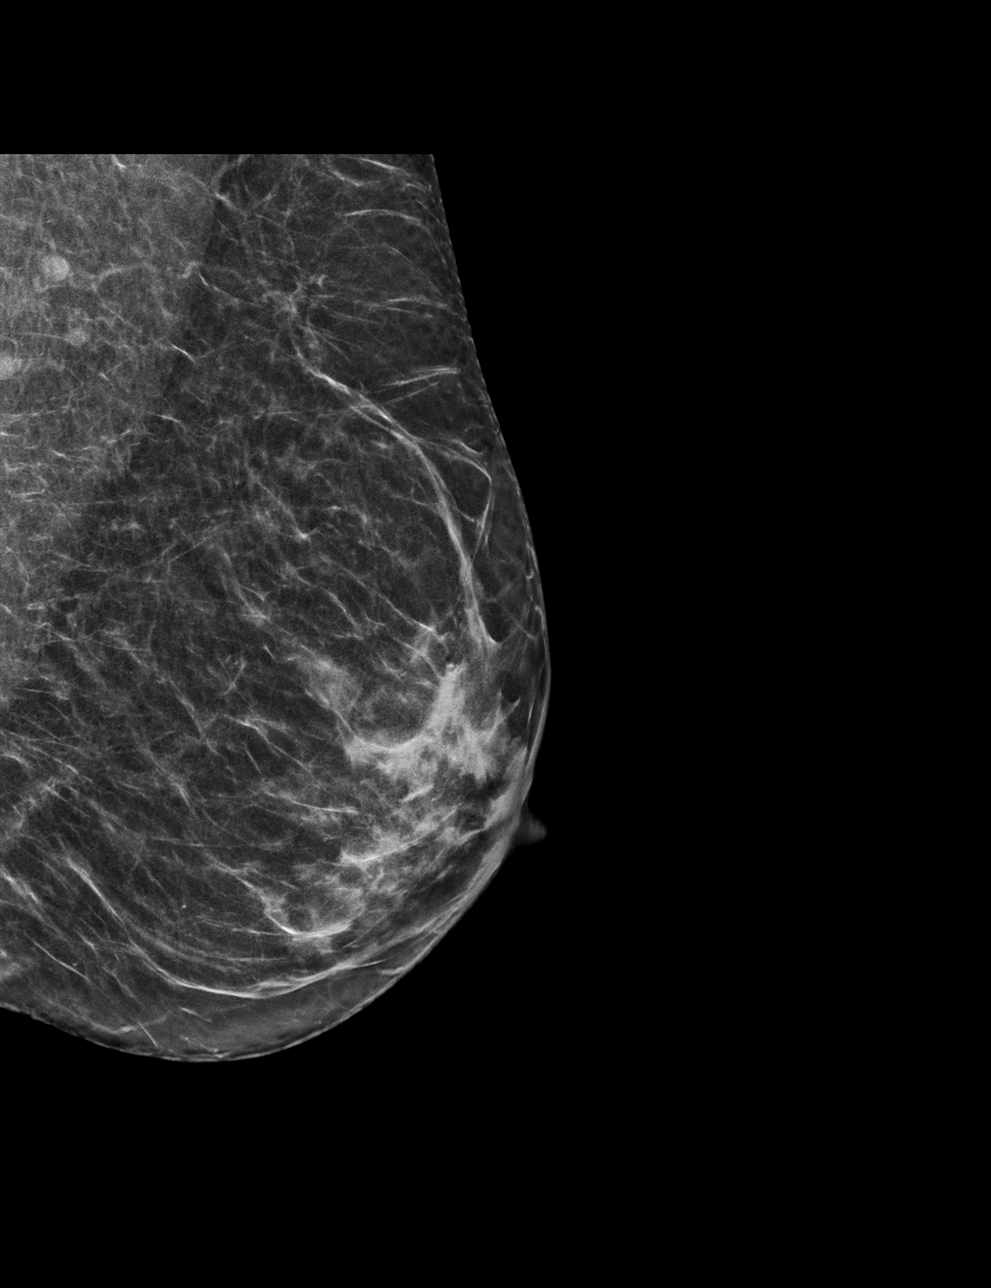

[L MLO synth-2D]
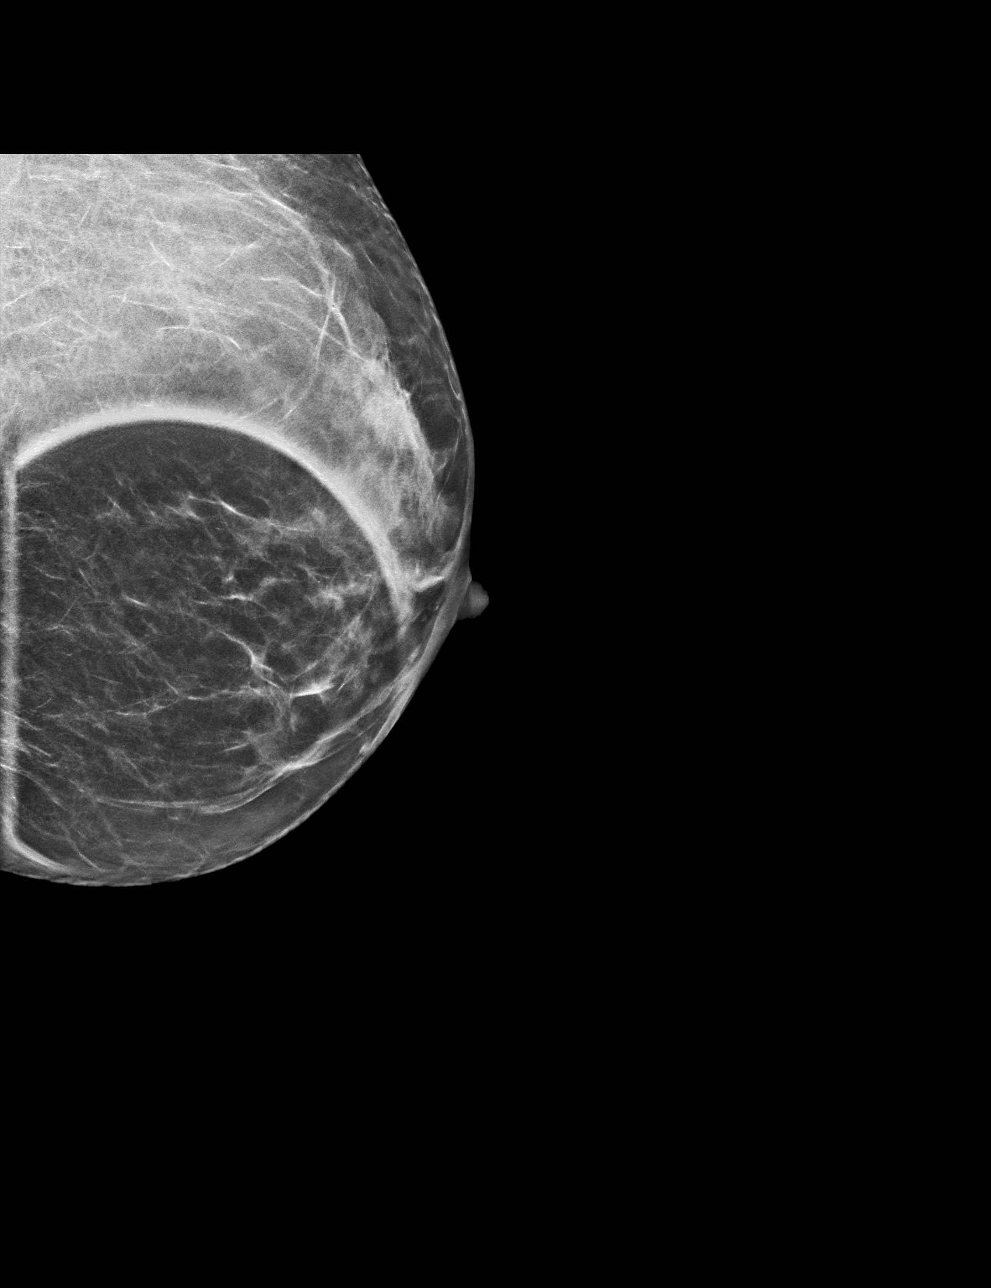

[L ML tomo · tomo slice 29/56.0]
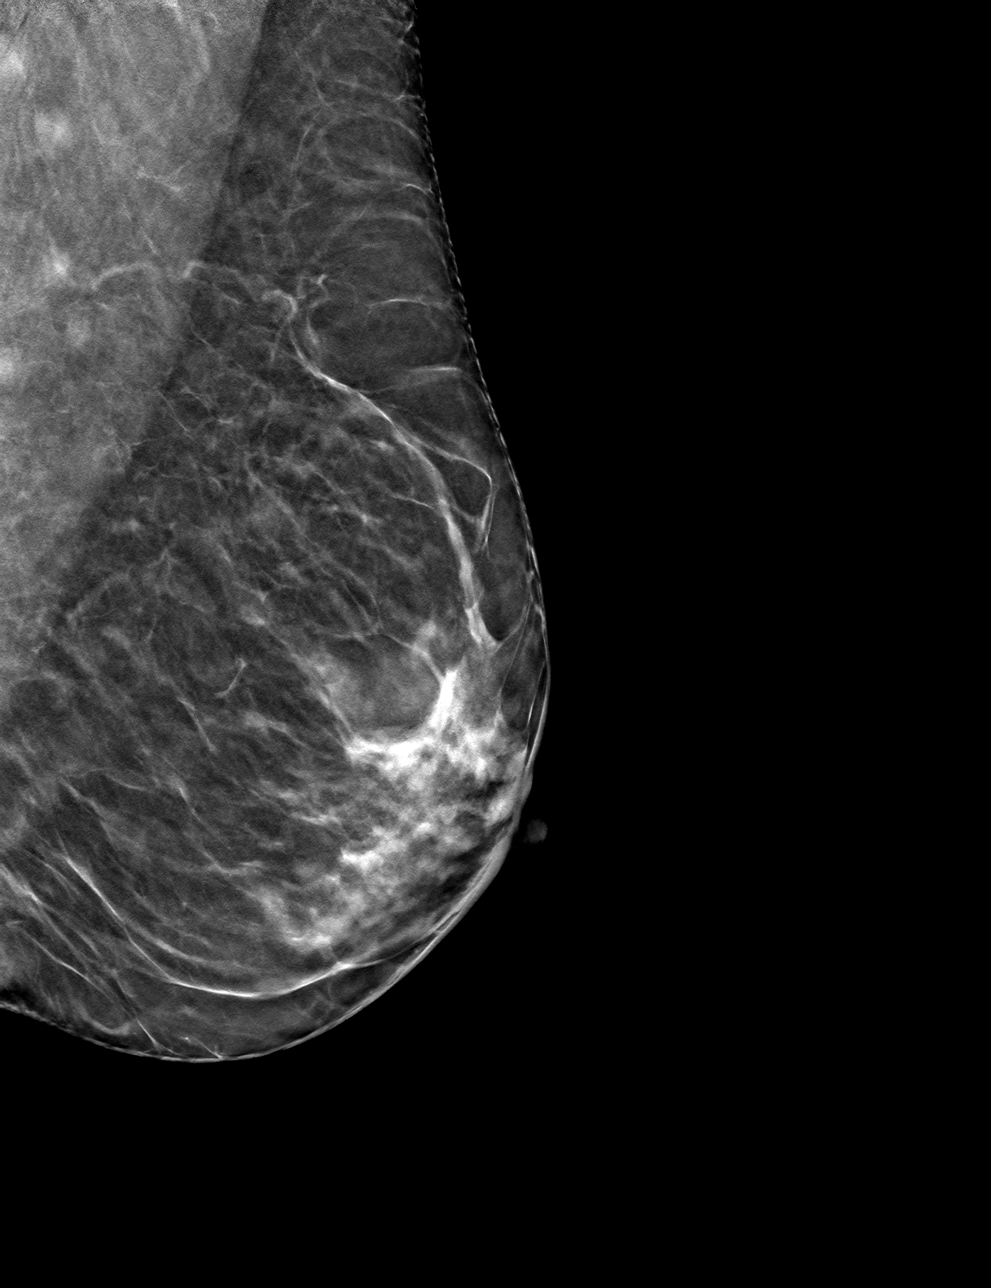

[L MLO tomo · tomo slice 25/49.0]
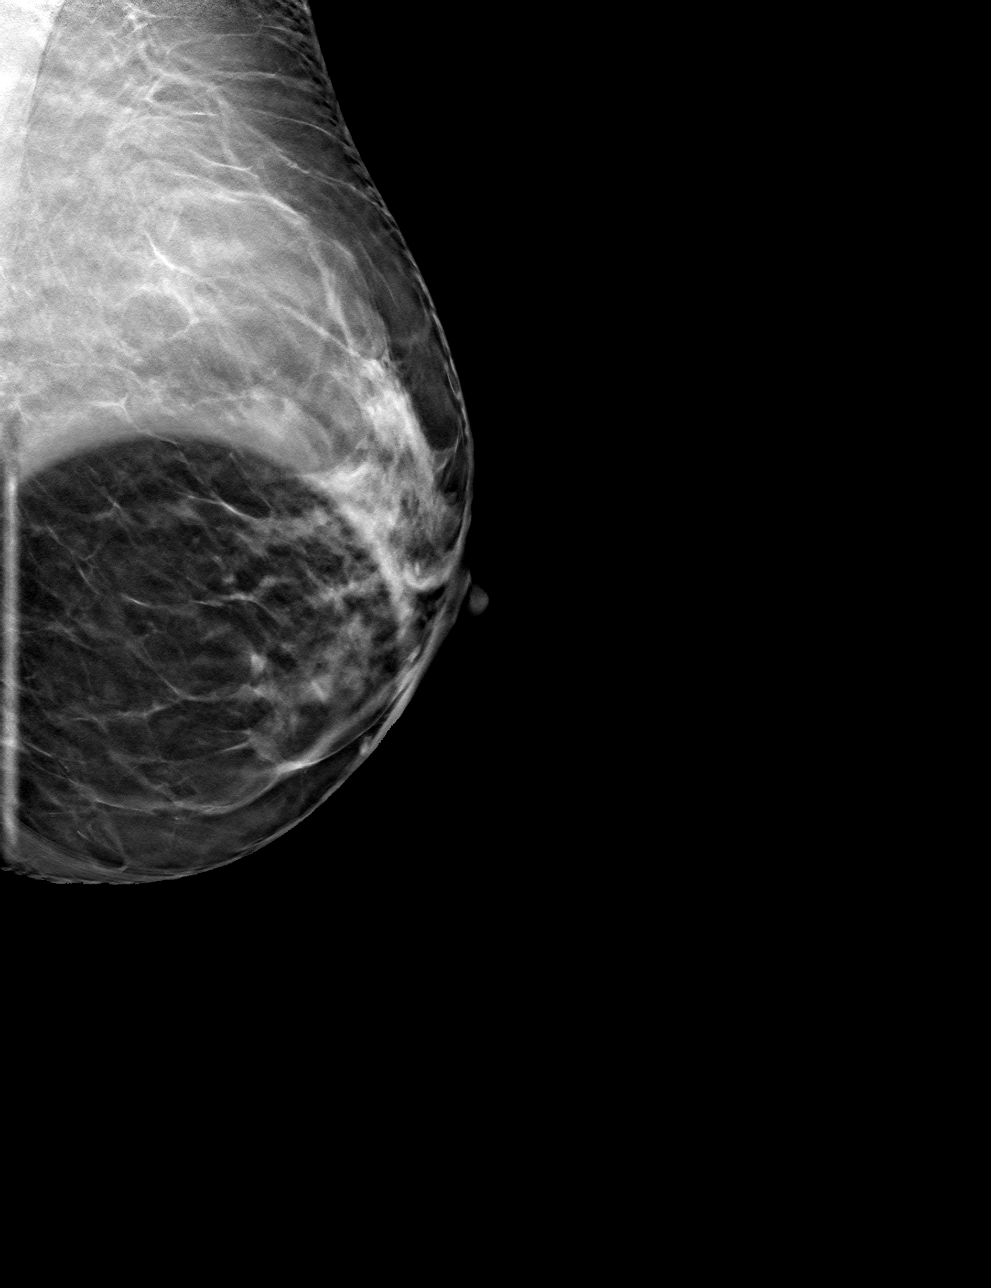

[4 of 12 positions shown; findings below may reference images not displayed]

ACR Breast Density Category c: The breast tissue is heterogeneously
dense, which may obscure small masses.
FINDINGS: 3D tomographic and 2D generated true lateral and spot compression
oblique views of the left breast demonstrate normal appearing
fibroglandular tissue at the location of recently suspected
asymmetry.

Mammographic images were processed with CAD.
IMPRESSION: No evidence of malignancy. The recently suspected left breast
asymmetry was close apposition of normal breast tissue.

RECOMMENDATION:
Bilateral screening mammogram in 1 year.

I have discussed the findings and recommendations with the patient.
If applicable, a reminder letter will be sent to the patient
regarding the next appointment.

BI-RADS CATEGORY  1: Negative.

## 2021-03-04 ENCOUNTER — Ambulatory Visit (INDEPENDENT_AMBULATORY_CARE_PROVIDER_SITE_OTHER): Payer: BC Managed Care – PPO | Admitting: Obstetrics and Gynecology

## 2021-03-04 ENCOUNTER — Encounter: Payer: Self-pay | Admitting: Obstetrics and Gynecology

## 2021-03-04 ENCOUNTER — Other Ambulatory Visit: Payer: Self-pay

## 2021-03-04 VITALS — BP 110/64 | HR 68 | Ht 68.0 in | Wt 144.0 lb

## 2021-03-04 DIAGNOSIS — Z01419 Encounter for gynecological examination (general) (routine) without abnormal findings: Secondary | ICD-10-CM | POA: Diagnosis not present

## 2021-03-04 DIAGNOSIS — Z5181 Encounter for therapeutic drug level monitoring: Secondary | ICD-10-CM

## 2021-03-04 DIAGNOSIS — Z79890 Hormone replacement therapy (postmenopausal): Secondary | ICD-10-CM

## 2021-03-04 MED ORDER — ESTRADIOL 0.05 MG/24HR TD PTTW: 1.0000 | MEDICATED_PATCH | TRANSDERMAL | 3 refills | Status: DC

## 2021-03-04 NOTE — Patient Instructions (Signed)
EXERCISE   We recommended that you start or continue a regular exercise program for good health. Physical activity is anything that gets your body moving, some is better than none. The CDC recommends 150 minutes per week of Moderate-Intensity Aerobic Activity and 2 or more days of Muscle Strengthening Activity.  Benefits of exercise are limitless: helps weight loss/weight maintenance, improves mood and energy, helps with depression and anxiety, improves sleep, tones and strengthens muscles, improves balance, improves bone density, protects from chronic conditions such as heart disease, high blood pressure and diabetes and so much more. To learn more visit: https://www.cdc.gov/physicalactivity/index.html  DIET: Good nutrition starts with a healthy diet of fruits, vegetables, whole grains, and lean protein sources. Drink plenty of water for hydration. Minimize empty calories, sodium, sweets. For more information about dietary recommendations visit: https://health.gov/our-work/nutrition-physical-activity/dietary-guidelines and https://www.myplate.gov/  ALCOHOL:  Women should limit their alcohol intake to no more than 7 drinks/beers/glasses of wine (combined, not each!) per week. Moderation of alcohol intake to this level decreases your risk of breast cancer and liver damage.  If you are concerned that you may have a problem, or your friends have told you they are concerned about your drinking, there are many resources to help. A well-known program that is free, effective, and available to all people all over the nation is Alcoholics Anonymous.  Check out this site to learn more: https://www.aa.org/   CALCIUM AND VITAMIN D:  Adequate intake of calcium and Vitamin D are recommended for bone health.  You should be getting between 1000-1200 mg of calcium and 800 units of Vitamin D daily between diet and supplements  PAP SMEARS:  Pap smears, to check for cervical cancer or precancers,  have traditionally been  done yearly, scientific advances have shown that most women can have pap smears less often.  However, every woman still should have a physical exam from her gynecologist every year. It will include a breast check, inspection of the vulva and vagina to check for abnormal growths or skin changes, a visual exam of the cervix, and then an exam to evaluate the size and shape of the uterus and ovaries. We will also provide age appropriate advice regarding health maintenance, like when you should have certain vaccines, screening for sexually transmitted diseases, bone density testing, colonoscopy, mammograms, etc.   MAMMOGRAMS:  All women over 40 years old should have a routine mammogram.   COLON CANCER SCREENING: Now recommend starting at age 45. At this time colonoscopy is not covered for routine screening until 50. There are take home tests that can be done between 45-49.   COLONOSCOPY:  Colonoscopy to screen for colon cancer is recommended for all women at age 50.  We know, you hate the idea of the prep.  We agree, BUT, having colon cancer and not knowing it is worse!!  Colon cancer so often starts as a polyp that can be seen and removed at colonscopy, which can quite literally save your life!  And if your first colonoscopy is normal and you have no family history of colon cancer, most women don't have to have it again for 10 years.  Once every ten years, you can do something that may end up saving your life, right?  We will be happy to help you get it scheduled when you are ready.  Be sure to check your insurance coverage so you understand how much it will cost.  It may be covered as a preventative service at no cost, but you should check   your particular policy.      Breast Self-Awareness Breast self-awareness means being familiar with how your breasts look and feel. It involves checking your breasts regularly and reporting any changes to your health care provider. Practicing breast self-awareness is  important. A change in your breasts can be a sign of a serious medical problem. Being familiar with how your breasts look and feel allows you to find any problems early, when treatment is more likely to be successful. All women should practice breast self-awareness, including women who have had breast implants. How to do a breast self-exam One way to learn what is normal for your breasts and whether your breasts are changing is to do a breast self-exam. To do a breast self-exam: Look for Changes  1. Remove all the clothing above your waist. 2. Stand in front of a mirror in a room with good lighting. 3. Put your hands on your hips. 4. Push your hands firmly downward. 5. Compare your breasts in the mirror. Look for differences between them (asymmetry), such as: ? Differences in shape. ? Differences in size. ? Puckers, dips, and bumps in one breast and not the other. 6. Look at each breast for changes in your skin, such as: ? Redness. ? Scaly areas. 7. Look for changes in your nipples, such as: ? Discharge. ? Bleeding. ? Dimpling. ? Redness. ? A change in position. Feel for Changes Carefully feel your breasts for lumps and changes. It is best to do this while lying on your back on the floor and again while sitting or standing in the shower or tub with soapy water on your skin. Feel each breast in the following way:  Place the arm on the side of the breast you are examining above your head.  Feel your breast with the other hand.  Start in the nipple area and make  inch (2 cm) overlapping circles to feel your breast. Use the pads of your three middle fingers to do this. Apply light pressure, then medium pressure, then firm pressure. The light pressure will allow you to feel the tissue closest to the skin. The medium pressure will allow you to feel the tissue that is a little deeper. The firm pressure will allow you to feel the tissue close to the ribs.  Continue the overlapping circles,  moving downward over the breast until you feel your ribs below your breast.  Move one finger-width toward the center of the body. Continue to use the  inch (2 cm) overlapping circles to feel your breast as you move slowly up toward your collarbone.  Continue the up and down exam using all three pressures until you reach your armpit.  Write Down What You Find  Write down what is normal for each breast and any changes that you find. Keep a written record with breast changes or normal findings for each breast. By writing this information down, you do not need to depend only on memory for size, tenderness, or location. Write down where you are in your menstrual cycle, if you are still menstruating. If you are having trouble noticing differences in your breasts, do not get discouraged. With time you will become more familiar with the variations in your breasts and more comfortable with the exam. How often should I examine my breasts? Examine your breasts every month. If you are breastfeeding, the best time to examine your breasts is after a feeding or after using a breast pump. If you menstruate, the best time to   examine your breasts is 5-7 days after your period is over. During your period, your breasts are lumpier, and it may be more difficult to notice changes. When should I see my health care provider? See your health care provider if you notice:  A change in shape or size of your breasts or nipples.  A change in the skin of your breast or nipples, such as a reddened or scaly area.  Unusual discharge from your nipples.  A lump or thick area that was not there before.  Pain in your breasts.  Anything that concerns you.   Abdominal Bloating When you have abdominal bloating, your abdomen may feel full, tight, or painful. It may also look bigger than normal or swollen (distended). Common causes of abdominal bloating include:  Swallowing air.  Constipation.  Problems digesting  food.  Eating too much.  Irritable bowel syndrome. This is a condition that affects the large intestine.  Lactose intolerance. This is an inability to digest lactose, a natural sugar in dairy products.  Celiac disease. This is a condition that affects the ability to digest gluten, a protein found in some grains.  Gastroparesis. This is a condition that slows down the movement of food in the stomach and small intestine. It is more common in people with diabetes mellitus.  Gastroesophageal reflux disease (GERD). This is a digestive condition that makes stomach acid flow back into the esophagus.  Urinary retention. This means that the body is holding onto urine, and the bladder cannot be emptied all the way. Follow these instructions at home: Eating and drinking  Avoid eating too much.  Try not to swallow air while talking or eating.  Avoid eating while lying down.  Avoid these foods and drinks: ? Foods that cause gas, such as broccoli, cabbage, cauliflower, and baked beans. ? Carbonated drinks. ? Hard candy. ? Chewing gum. Medicines  Take over-the-counter and prescription medicines only as told by your health care provider.  Take probiotic medicines. These medicines contain live bacteria or yeasts that can help digestion.  Take coated peppermint oil capsules. Activity  Try to exercise regularly. Exercise may help to relieve bloating that is caused by gas and relieve constipation. General instructions  Keep all follow-up visits as told by your health care provider. This is important. Contact a health care provider if:  You have nausea and vomiting.  You have diarrhea.  You have abdominal pain.  You have unusual weight loss or weight gain.  You have severe pain, and medicines do not help. Get help right away if:  You have severe chest pain.  You have trouble breathing.  You have shortness of breath.  You have trouble urinating.  You have darker urine than  normal.  You have blood in your stools or have dark, tarry stools. Summary  Abdominal bloating means that the abdomen is swollen.  Common causes of abdominal bloating are swallowing air, constipation, and problems digesting food.  Avoid eating too much and avoid swallowing air.  Avoid foods that cause gas, carbonated drinks, hard candy, and chewing gum. This information is not intended to replace advice given to you by your health care provider. Make sure you discuss any questions you have with your health care provider. Document Revised: 02/12/2019 Document Reviewed: 11/26/2016 Elsevier Patient Education  2021 [employer].

## 2021-06-03 ENCOUNTER — Ambulatory Visit
Admission: EM | Admit: 2021-06-03 | Discharge: 2021-06-03 | Disposition: A | Discharge status: Home / Self Care | Payer: BC Managed Care – PPO | Attending: Family Medicine | Admitting: Family Medicine

## 2021-06-03 ENCOUNTER — Other Ambulatory Visit: Payer: Self-pay

## 2021-06-03 DIAGNOSIS — H6691 Otitis media, unspecified, right ear: Secondary | ICD-10-CM

## 2021-06-03 MED ORDER — AMOXICILLIN 500 MG PO CAPS: 500.0000 mg | ORAL_CAPSULE | Freq: Two times a day (BID) | ORAL | 0 refills | Status: AC

## 2021-06-03 NOTE — ED Provider Notes (Signed)
[facility]    CSN: [id number] Arrival date & time: 06/03/21  0809      History   Chief Complaint Chief Complaint  Patient presents with   Otalgia    HPI Natasha Howell is a 55 y.o. female.   Patient is a 55 year old female presents with complaint of right leg pain that started last night.  Patient states she swims laps 5 days a week.  She also reports some cold-like symptoms this past week.  Patient reports pain that the ear "feels blocked".  Patient took some Tylenol last night and used a warm compress.  She also reports using her swimmer's ear drops.  Patient denies any issues with the left ear.  Reports sounds are muffled on the right side.  Patient has allergies to morphine but nothing else   Past Medical History:  Diagnosis Date   Allergy    Anxiety    GERD (gastroesophageal reflux disease)    Heart murmur    Heard on occasion-is not under treatment   History of chicken pox    History of fainting spells of unknown cause    History of recurrent UTIs    Vitiligo     Patient Active Problem List   Diagnosis Date Noted   Vitiligo 08/25/2018   Situational anxiety 08/25/2018   Cardiac syncope 11/25/2017   Palpitations 11/25/2017    Past Surgical History:  Procedure Laterality Date   TOTAL VAGINAL HYSTERECTOMY  2001   with unilateral salpingo-oophorectomy   WISDOM TOOTH EXTRACTION      OB History     Gravida  2   Para  2   Term  2   Preterm      AB      Living  2      SAB      IAB      Ectopic      Multiple      Live Births  2            Home Medications    Prior to Admission medications   Medication Sig Start Date End Date Taking? Authorizing Provider  amoxicillin (AMOXIL) 500 MG capsule Take 1 capsule (500 mg total) by mouth 2 (two) times daily for 7 days. 06/03/21 06/10/21 Yes Candis Schatz, PA-C  clonazePAM (KLONOPIN) 0.5 MG tablet TAKE 1/2 TABLET AS NEEDED FOR TRAVEL 02/25/21   Orland Mustard, MD  estradiol  (VIVELLE-DOT) 0.05 MG/24HR patch Place 1 patch (0.05 mg total) onto the skin 2 (two) times a week. 03/05/21   Romualdo Bolk, MD  fluticasone (FLONASE) 50 MCG/ACT nasal spray Place 2 sprays into both nostrils daily for 15 days. 02/21/21 03/08/21  Eusebio Friendly B, PA-C  Ibuprofen (ADVIL PO) Take by mouth.    [provider]  Lactobacillus (PROBIOTIC ACIDOPHILUS PO) Take by mouth.    [provider]  Turmeric (QC TUMERIC COMPLEX PO) Take by mouth.    [provider]    Family History Family History  Problem Relation Age of Onset   Hypertension Mother    Arthritis Mother    Depression Mother    Hyperlipidemia Mother    Diabetes Sister    Mental illness Sister    Depression Brother    Anxiety disorder Brother    Breast cancer Maternal Aunt    Colon cancer Neg Hx    Colon polyps Neg Hx    Esophageal cancer Neg Hx    Rectal cancer Neg Hx  Stomach cancer Neg Hx     Social History Social History   Tobacco Use   Smoking status: Never   Smokeless tobacco: Never  Vaping Use   Vaping Use: Never used  Substance Use Topics   Alcohol use: Yes    Alcohol/week: 7.0 standard drinks    Types: 7 Glasses of wine per week   Drug use: No     Allergies   Morphine and related   Review of Systems Review of Systems as noted above in HPI.  Other systems reviewed and found to be negative   Physical Exam Triage Vital Signs ED Triage Vitals  Enc Vitals Group     BP 06/03/21 0815 114/63     Pulse Rate 06/03/21 0815 64     Resp 06/03/21 0815 16     Temp 06/03/21 0815 97.6 F (36.4 C)     Temp Source 06/03/21 0815 Oral     SpO2 06/03/21 0815 98 %     Weight 06/03/21 0816 142 lb (64.4 kg)     Height 06/03/21 0816 5\' 8"  (1.727 m)     Head Circumference --      Peak Flow --      Pain Score 06/03/21 0816 3     Pain Loc --      Pain Edu? --      Excl. in GC? --    No data found.  Updated Vital Signs BP 114/63   Pulse 64   Temp 97.6 F (36.4 C)  (Oral)   Resp 16   Ht 5\' 8"  (1.727 m)   Wt 142 lb (64.4 kg)   LMP  (LMP Unknown)   SpO2 98%   BMI 21.59 kg/m       Physical Exam Constitutional:      General: She is not in acute distress.    Appearance: Normal appearance. She is not ill-appearing.  HENT:     Right Ear: Tympanic membrane is erythematous and bulging.     Left Ear: Tympanic membrane, ear canal and external ear normal.     Ears:     Comments: Erythema to distal canal    Nose: Nose normal. No congestion or rhinorrhea.  Cardiovascular:     Rate and Rhythm: Normal rate and regular rhythm.  Pulmonary:     Effort: Pulmonary effort is normal.     Breath sounds: Normal breath sounds.  Musculoskeletal:     Cervical back: Normal range of motion and neck supple.  Lymphadenopathy:     Cervical: No cervical adenopathy.  Neurological:     Mental Status: She is alert.     UC Treatments / Results  Labs (all labs ordered are listed, but only abnormal results are displayed) Labs Reviewed - No data to display  EKG   Radiology No results found.  Procedures Procedures (including critical care time)  Medications Ordered in UC Medications - No data to display  Initial Impression / Assessment and Plan / UC Course  I have reviewed the triage vital signs and the nursing notes.  Pertinent labs & imaging results that were available during my care of the patient were reviewed by me and considered in my medical decision making (see chart for details).     Pt with redness to TM and sensation of fullness with bulging TM. Regular swimmer but no debris or drainage in canal.  Will give prescription for amoxicillin.  Final Clinical Impressions(s) / UC Diagnoses   Final diagnoses:  Right otitis media,  unspecified otitis media type     Discharge Instructions      -Amoxicillin: one tablet twice a day for one week -Over the counter medications for pain -return to clinic should symptoms worsen or not  improve.     ED Prescriptions     Medication Sig Dispense Auth. Provider   amoxicillin (AMOXIL) 500 MG capsule Take 1 capsule (500 mg total) by mouth 2 (two) times daily for 7 days. 14 capsule Candis Schatz, PA-C      PDMP not reviewed this encounter.   Candis Schatz, PA-C 06/03/21 [phone]

## 2021-06-03 NOTE — ED Triage Notes (Signed)
Pt reports having R ear pain that began last night.

## 2021-06-03 NOTE — Discharge Instructions (Addendum)
-  Amoxicillin: one tablet twice a day for one week -Over the counter medications for pain -return to clinic should symptoms worsen or not improve.

## 2021-09-11 ENCOUNTER — Ambulatory Visit: Payer: Self-pay

## 2021-09-30 ENCOUNTER — Encounter: Payer: BC Managed Care – PPO | Admitting: Family Medicine

## 2021-09-30 ENCOUNTER — Encounter: Payer: BC Managed Care – PPO | Admitting: Physician Assistant

## 2021-10-28 ENCOUNTER — Other Ambulatory Visit: Payer: Self-pay

## 2021-10-28 ENCOUNTER — Ambulatory Visit (INDEPENDENT_AMBULATORY_CARE_PROVIDER_SITE_OTHER): Payer: BC Managed Care – PPO | Admitting: Physician Assistant

## 2021-10-28 VITALS — BP 105/67 | HR 54 | Temp 97.8°F | Ht 68.0 in | Wt 147.4 lb

## 2021-10-28 DIAGNOSIS — Z13220 Encounter for screening for lipoid disorders: Secondary | ICD-10-CM

## 2021-10-28 DIAGNOSIS — Z780 Asymptomatic menopausal state: Secondary | ICD-10-CM

## 2021-10-28 DIAGNOSIS — Z131 Encounter for screening for diabetes mellitus: Secondary | ICD-10-CM

## 2021-10-28 DIAGNOSIS — Z0000 Encounter for general adult medical examination without abnormal findings: Secondary | ICD-10-CM | POA: Diagnosis not present

## 2021-10-28 NOTE — Progress Notes (Signed)
Subjective:    Patient ID: Natasha Howell, female    DOB: October 10, 1966, 55 y.o.   MRN: [id number]  Chief Complaint  Patient presents with   Annual Exam    HPI 55 y.o. patient presents today for new patient establishment with me.  Patient was previously established with Dr. Artis Flock.  Current Care Team: -GYN - estradiol patch -Dermatology   Acute concerns: -Head cold started this week, COVID-19 negative; mild symptoms just some head congestion right now, no fever or body aches or other symptoms  Health maintenance: Lifestyle/ exercise: Usually enjoys swimming or the gym 3-4 times per week Nutrition: Cooks at home  Mental health: Klonopin prn travel, otherwise no concerns Caffeine: None  Sleep: Normally does well  Substance use: None ETOH: Wine with dinner, usually red  Immunizations: She hasn't had Shingles vaccine yet Colonoscopy: Due in 2032  Pap: No longer neded Mammogram: UTD  DEXA: She has not had a bone density scan yet     Past Medical History:  Diagnosis Date   Allergy    Anxiety    GERD (gastroesophageal reflux disease)    Heart murmur    Heard on occasion-is not under treatment   History of chicken pox    History of fainting spells of unknown cause    History of recurrent UTIs    Vitiligo     Past Surgical History:  Procedure Laterality Date   TOTAL VAGINAL HYSTERECTOMY  2001   with unilateral salpingo-oophorectomy   WISDOM TOOTH EXTRACTION      Family History  Problem Relation Age of Onset   Hypertension Mother    Arthritis Mother    Depression Mother    Hyperlipidemia Mother    Diabetes Sister    Mental illness Sister    Depression Brother    Anxiety disorder Brother    Breast cancer Maternal Aunt    Colon cancer Neg Hx    Colon polyps Neg Hx    Esophageal cancer Neg Hx    Rectal cancer Neg Hx    Stomach cancer Neg Hx     Social History   Tobacco Use   Smoking status: Never   Smokeless tobacco: Never  Vaping Use   Vaping  Use: Never used  Substance Use Topics   Alcohol use: Yes    Alcohol/week: 7.0 standard drinks    Types: 7 Glasses of wine per week   Drug use: No     Allergies  Allergen Reactions   Morphine And Related     Review of Systems NEGATIVE UNLESS OTHERWISE INDICATED IN HPI      Objective:     BP 105/67    Pulse (!) 54    Temp 97.8 F (36.6 C)    Ht 5\' 8"  (1.727 m)    Wt 147 lb 5.8 oz (66.8 kg)    LMP  (LMP Unknown)    SpO2 100%    BMI 22.41 kg/m   Wt Readings from Last 3 Encounters:  10/28/21 147 lb 5.8 oz (66.8 kg)  06/03/21 142 lb (64.4 kg)  03/04/21 144 lb (65.3 kg)    BP Readings from Last 3 Encounters:  10/28/21 105/67  06/03/21 114/63  03/04/21 110/64     Physical Exam Vitals and nursing note reviewed.  Constitutional:      Appearance: Normal appearance. She is normal weight. She is not toxic-appearing.  HENT:     Head: Normocephalic and atraumatic.     Right Ear: Tympanic membrane, ear canal  and external ear normal.     Left Ear: Tympanic membrane, ear canal and external ear normal.     Nose: Nose normal.     Mouth/Throat:     Mouth: Mucous membranes are moist.  Eyes:     Extraocular Movements: Extraocular movements intact.     Conjunctiva/sclera: Conjunctivae normal.     Pupils: Pupils are equal, round, and reactive to light.  Cardiovascular:     Rate and Rhythm: Normal rate and regular rhythm.     Pulses: Normal pulses.     Heart sounds: Normal heart sounds.  Pulmonary:     Effort: Pulmonary effort is normal.     Breath sounds: Normal breath sounds.  Abdominal:     General: Abdomen is flat. Bowel sounds are normal.     Palpations: Abdomen is soft.  Musculoskeletal:        General: Normal range of motion.     Cervical back: Normal range of motion and neck supple.  Skin:    General: Skin is warm and dry.     Comments: Diffuse patches of hypopigmentation c/w her hx of vitiligo   Neurological:     General: No focal deficit present.     Mental  Status: She is alert and oriented to person, place, and time.  Psychiatric:        Mood and Affect: Mood normal.        Behavior: Behavior normal.        Thought Content: Thought content normal.        Judgment: Judgment normal.       Assessment & Plan:   Problem List Items Addressed This Visit   None Visit Diagnoses     Encounter for annual physical exam    -  Primary   Relevant Orders   CBC with Differential/Platelet   Comprehensive metabolic panel   Lipid panel   Postmenopausal       Relevant Orders   DG Bone Density   CBC with Differential/Platelet   Comprehensive metabolic panel   Lipid panel   Diabetes mellitus screening       Relevant Orders   Comprehensive metabolic panel   Screening for cholesterol level       Relevant Orders   Lipid panel       Plan: -Age-appropriate screening and counseling performed today. Will check labs and call with results. Preventive measures discussed and printed in AVS for patient. Doing well taking care of her health. She will consider Shingrix vaccine. UTD on most preventive items just needs bone density test. Vision / dental annually. Plan to f/up in 1 year or prn.    Natasha Langille M Remijio Holleran, PA-C

## 2021-10-28 NOTE — Patient Instructions (Addendum)
Good to meet you today! Please schedule for fasting labs. Someone will call to schedule bone density scan.  See you back in around 1 year, sooner if any concerns.

## 2021-11-11 ENCOUNTER — Other Ambulatory Visit: Payer: Self-pay | Admitting: Physician Assistant

## 2021-11-11 DIAGNOSIS — Z1231 Encounter for screening mammogram for malignant neoplasm of breast: Secondary | ICD-10-CM

## 2021-11-12 ENCOUNTER — Other Ambulatory Visit: Payer: BC Managed Care – PPO

## 2021-12-09 DIAGNOSIS — Z1231 Encounter for screening mammogram for malignant neoplasm of breast: Secondary | ICD-10-CM

## 2021-12-22 ENCOUNTER — Ambulatory Visit
Admission: RE | Admit: 2021-12-22 | Discharge: 2021-12-22 | Disposition: A | Discharge status: Home / Self Care | Payer: BC Managed Care – PPO | Source: Ambulatory Visit | Attending: Physician Assistant | Admitting: Physician Assistant

## 2021-12-22 DIAGNOSIS — Z1231 Encounter for screening mammogram for malignant neoplasm of breast: Secondary | ICD-10-CM

## 2022-01-29 ENCOUNTER — Other Ambulatory Visit: Payer: Self-pay | Admitting: Obstetrics and Gynecology

## 2022-01-29 DIAGNOSIS — Z5181 Encounter for therapeutic drug level monitoring: Secondary | ICD-10-CM

## 2022-01-29 NOTE — Telephone Encounter (Signed)
Last AEX 03/04/21--scheduled 03/10/22. ?Mammo UTD.  ?

## 2022-02-15 ENCOUNTER — Other Ambulatory Visit: Payer: Self-pay

## 2022-02-15 ENCOUNTER — Ambulatory Visit
Admission: EM | Admit: 2022-02-15 | Discharge: 2022-02-15 | Disposition: A | Discharge status: Home / Self Care | Payer: BC Managed Care – PPO | Attending: Internal Medicine | Admitting: Internal Medicine

## 2022-02-15 DIAGNOSIS — J029 Acute pharyngitis, unspecified: Secondary | ICD-10-CM | POA: Diagnosis present

## 2022-02-15 LAB — GROUP A STREP BY PCR: Group A Strep by PCR: NOT DETECTED

## 2022-02-15 MED ORDER — AZITHROMYCIN 500 MG PO TABS: 500.0000 mg | ORAL_TABLET | Freq: Every day | ORAL | 0 refills | Status: DC

## 2022-02-15 MED ORDER — PREDNISONE 20 MG PO TABS: 20.0000 mg | ORAL_TABLET | Freq: Every day | ORAL | 0 refills | Status: DC

## 2022-02-15 NOTE — Discharge Instructions (Addendum)
I am placing you on Azithromycin to cover other strains of strep and other bacterial that causes throat infection. ?Also placing you on a few days of prednisone to help the swelling on the R side, since I am concerned of early peritonsillar abscess.  ?If your sore throat gets worse, you need go to to ER, specially if the R side gets more swollen, since they may have to consult with ear nose and throat doctor.  ?You may continue Tylenol for pain for a few more days ?

## 2022-02-15 NOTE — ED Triage Notes (Signed)
Pt c/o sore throat x3days ? ?Pt has taken tylenol for temperature of 99 at 4am.  ? ?Pt is worried about strep throat and wants to be tested for strep.  ?

## 2022-02-15 NOTE — ED Provider Notes (Signed)
?[facility] ? ? ? ?CSN: [id number] ?Arrival date & time: 02/15/22  0809 ? ? ?  ? ?History   ?Chief Complaint ?Chief Complaint  ?Patient presents with  ? Sore Throat  ? ? ?HPI ?Natasha Howell is a 56 y.o. female who presents with onset of ST x 3 days. Had a temp of 99 at 4 am and she took Tylenol them. She is concerned about strep, and would like to be tested. 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  ?She woke up with severe ST this am at 3, and has had aches and HA.  ? ? ? ?Past Medical History:  ?Diagnosis Date  ? Allergy   ? Anxiety   ? GERD (gastroesophageal reflux disease)   ? Heart murmur   ? Heard on occasion-is not under treatment  ? History of chicken pox   ? History of fainting spells of unknown cause   ? History of recurrent UTIs   ? Vitiligo   ? ? ?Patient Active Problem List  ? Diagnosis Date Noted  ? Vitiligo 08/25/2018  ? Situational anxiety 08/25/2018  ? Cardiac syncope 11/25/2017  ? Palpitations 11/25/2017  ? ? ?Past Surgical History:  ?Procedure Laterality Date  ? TOTAL VAGINAL HYSTERECTOMY  2001  ? with unilateral salpingo-oophorectomy  ? WISDOM TOOTH EXTRACTION    ? ? ?OB History   ? ? Gravida  ?2  ? Para  ?2  ? Term  ?2  ? Preterm  ?   ? AB  ?   ? Living  ?2  ?  ? ? SAB  ?   ? IAB  ?   ? Ectopic  ?   ? Multiple  ?   ? Live Births  ?2  ?   ?  ?  ? ? ? ?Home Medications   ? ?Prior to Admission medications   ?Medication Sig Start Date End Date Taking? Authorizing Provider  ?azithromycin (ZITHROMAX) 500 MG tablet Take 1 tablet (500 mg total) by mouth daily. 02/15/22  Yes Rodriguez-Southworth, Sunday Spillers, PA-C  ?clonazePAM (KLONOPIN) 0.5 MG tablet TAKE 1/2 TABLET AS NEEDED FOR TRAVEL 02/25/21  Yes Orma Flaming, MD  ?estradiol (VIVELLE-DOT) 0.05 MG/24HR patch PLACE 1 PATCH (0.05 MG TOTAL) ONTO THE SKIN 2 (TWO) TIMES A WEEK. 02/01/22  Yes Salvadore Dom, MD  ?Ibuprofen (ADVIL PO) Take by mouth.   Yes  [provider]  ?Lactobacillus (PROBIOTIC ACIDOPHILUS PO) Take by mouth.   Yes [provider]  ?predniSONE (DELTASONE) 20 MG tablet Take 1 tablet (20 mg total) by mouth daily with breakfast. 02/15/22  Yes Rodriguez-Southworth, Sunday Spillers, PA-C  ?Turmeric (QC TUMERIC COMPLEX PO) Take by mouth.   Yes [provider]  ?fluticasone (FLONASE) 50 MCG/ACT nasal spray Place 2 sprays into both nostrils daily for 15 days. 02/21/21 10/28/21  Danton Clap, PA-C  ? ? ?Family History ?Family History  ?Problem Relation Age of Onset  ? Hypertension Mother   ? Arthritis Mother   ? Depression Mother   ? Hyperlipidemia Mother   ? Diabetes Sister   ? Mental illness Sister   ? Depression Brother   ? Anxiety disorder Brother   ? Breast cancer Maternal Aunt   ? Colon cancer Neg Hx   ? Colon polyps Neg Hx   ? Esophageal cancer Neg Hx   ? Rectal cancer Neg Hx   ? Stomach cancer  Neg Hx   ? ? ?Social History ?Social History  ? ?Tobacco Use  ? Smoking status: Never  ? Smokeless tobacco: Never  ?Vaping Use  ? Vaping Use: Never used  ?Substance Use Topics  ? Alcohol use: Yes  ?  Alcohol/week: 7.0 standard drinks  ?  Types: 7 Glasses of wine per week  ? Drug use: No  ? ? ? ?Allergies   ?Morphine and related ? ? ?Review of Systems ?Review of Systems  ?Constitutional:  Positive for fatigue and fever.  ?HENT:  Positive for sore throat. Negative for congestion, postnasal drip and rhinorrhea.   ?Respiratory:  Negative for cough.   ?Musculoskeletal:  Negative for myalgias.  ?Neurological:  Positive for headaches.  ? ? ?Physical Exam ?Triage Vital Signs ?ED Triage Vitals  ?Enc Vitals Group  ?   BP 02/15/22 0827 118/68  ?   Pulse Rate 02/15/22 0827 71  ?   Resp 02/15/22 0827 18  ?   Temp 02/15/22 0827 99.2 ?F (37.3 ?C)  ?   Temp Source 02/15/22 0827 Oral  ?   SpO2 02/15/22 0827 97 %  ?   Weight 02/15/22 0824 144 lb (65.3 kg)  ?   Height 02/15/22 0824 5\' 8"  (1.727 m)  ?   Head Circumference --   ?   Peak Flow --   ?   Pain Score  02/15/22 0824 8  ?   Pain Loc --   ?   Pain Edu? --   ?   Excl. in [place]? --   ? ?No data found. ? ?Updated Vital Signs ?BP 118/68 (BP Location: Left Arm)   Pulse 71   Temp 99.2 ?F (37.3 ?C) (Oral)   Resp 18   Ht 5\' 8"  (1.727 m)   Wt 144 lb (65.3 kg)   LMP  (LMP Unknown)   SpO2 97%   BMI 21.90 kg/m?  ? ?Visual Acuity ?Right Eye Distance:   ?Left Eye Distance:   ?Bilateral Distance:   ? ?Right Eye Near:   ?Left Eye Near:    ?Bilateral Near:    ? ? ?Physical Exam ?Vitals signs and nursing note reviewed.  ?Constitutional:   ?   General: She is not in acute distress. ?   Appearance: Normal appearance. She is not ill-appearing, toxic-appearing or diaphoretic.  ?HENT:  ?   Head: Normocephalic.  ?   Right Ear:  external ear normal.  ?   Left Ear: external ear normal.  ?   Nose: Nose normal.  ?   Mouth/Throat: moderate erythema with mild swelling on R compared to the L. No exudate present. Her Uvula is slightly deviated to the L. Uvula is not swollen.  ?   Mouth: Mucous membranes are moist.  ?Eyes:  ?   General: No scleral icterus.    ?   Right eye: No discharge.     ?   Left eye: No discharge.  ?   Conjunctiva/sclera: Conjunctivae normal.  ?Neck:  ?   Musculoskeletal: Neck supple. No neck rigidity. Has upper chain cervical adenopathy, R>L which are tender ?Cardiovascular:  ?   Rate and Rhythm: Normal rate and regular rhythm.  ?   Heart sounds: No murmur.  ?Pulmonary:  ?   Effort: Pulmonary effort is normal.  ?   Breath sounds: Normal breath sounds.  ? ?Musculoskeletal: Normal range of motion.  ?Lymphadenopathy:  ?   Cervical: No cervical adenopathy.  ?Skin: ?   General: Skin is warm and dry.  ?  Coloration: Skin is not jaundiced.  ?   Findings: No rash.  ?Neurological:  ?   Mental Status: She is alert and oriented to person, place, and time.  ?   Gait: Gait normal.  ?Psychiatric:     ?   Mood and Affect: Mood normal.     ?   Behavior: Behavior normal.     ?   Thought Content: Thought content normal.     ?   Judgment:  Judgment normal.   ? ?UC Treatments / Results  ?Labs ?(all labs ordered are listed, but only abnormal results are displayed) ?Labs Reviewed  ?GROUP A STREP BY PCR  ?Strep PCR test is negative ? ?EKG ? ? ?Radiology ?No results found. ? ?Procedures ?Procedures (including critical care time) ? ?Medications Ordered in UC ?Medications - No data to display ? ?Initial Impression / Assessment and Plan / UC Course  ?I have reviewed the triage vital signs and the nursing notes. ?Pertinent labs  results that were available during my care of the patient were reviewed by me and considered in my medical decision making (see chart for details). ?Pharyngitis with possible early peritonsillar abscess ?I placed her on Azithromycin and prednisone. See instructions.  ? ? ? ?Final Clinical Impressions(s) / UC Diagnoses  ? ?Final diagnoses:  ?Acute pharyngitis, unspecified etiology  ? ? ? ?Discharge Instructions   ? ?  ?I am placing you on Azithromycin to cover other strains of strep and other bacterial that causes throat infection. ?Also placing you on a few days of prednisone to help the swelling on the R side, since I am concerned of early peritonsillar abscess.  ?If your sore throat gets worse, you need go to to ER, specially if the R side gets more swollen, since they may have to consult with ear nose and throat doctor.  ?You may continue Tylenol for pain for a few more days ? ? ? ? ?ED Prescriptions   ? ? Medication Sig Dispense Auth. Provider  ? azithromycin (ZITHROMAX) 500 MG tablet Take 1 tablet (500 mg total) by mouth daily. 5 tablet Rodriguez-Southworth, Sunday Spillers, PA-C  ? predniSONE (DELTASONE) 20 MG tablet Take 1 tablet (20 mg total) by mouth daily with breakfast. 3 tablet Rodriguez-Southworth, Sunday Spillers, PA-C  ? ?  ? ?PDMP not reviewed this encounter. ?  Shelby Mattocks, PA-C ?02/15/22 [id number] ? ?

## 2022-02-24 ENCOUNTER — Encounter: Payer: Self-pay | Admitting: Physician Assistant

## 2022-02-25 MED ORDER — CLONAZEPAM 0.5 MG PO TABS: ORAL_TABLET | ORAL | 0 refills | Status: DC

## 2022-03-03 NOTE — Progress Notes (Signed)
56 y.o. [id number] Married White or Caucasian Not Hispanic or Latino female here for annual exam.  H/O vaginal hysterectomy and unilateral salpingo-oophorectomy in her mid 63's for a dermoid cyst.  ?She was started on ERT in 11/20 for vasomotor symptoms. Overall doing well, occasional hot flash. Sexually active, no dyspareunia. ? ?No bowel or bladder c/o.  ?  ? ?No LMP recorded (lmp unknown). Patient has had a hysterectomy.          ?Sexually active: Yes.    ?The current method of family planning is status post hysterectomy.    ?Exercising: Yes.     Swimming and strength training  ?Smoker:  no ? ?Health Maintenance: ?Pap:  04/03/15 WNL Neg HPV  ?History of abnormal Pap:  yes repeat was normal  ?MMG:  12/22/21 Bi-rads 1 neg  ?BMD:   none  ?Colonoscopy: 12/17/20 normal f/u 10 years ?TDaP:  12/12/13  ?Gardasil: n/a ? ? reports that she has never smoked. She has never used smokeless tobacco. She reports current alcohol use of about 7.0 standard drinks per week. She reports that she does not use drugs. She is a professor in Human resources officer. Working at [employer]. 2 grown children, 44 year old daughter and 51 year old son. Daughter is married, lives in [place], therapist, no children. Son works for the [employer] in [employer].  ? ?Past Medical History:  ?Diagnosis Date  ? Allergy   ? Anxiety   ? GERD (gastroesophageal reflux disease)   ? Heart murmur   ? Heard on occasion-is not under treatment  ? History of chicken pox   ? History of fainting spells of unknown cause   ? History of recurrent UTIs   ? Vitiligo   ? ? ?Past Surgical History:  ?Procedure Laterality Date  ? TOTAL VAGINAL HYSTERECTOMY  2001  ? with unilateral salpingo-oophorectomy  ? WISDOM TOOTH EXTRACTION    ? ? ?Current Outpatient Medications  ?Medication Sig Dispense Refill  ? clonazePAM (KLONOPIN) 0.5 MG tablet TAKE 1/2 TABLET AS NEEDED FOR TRAVEL 20 tablet 0  ? estradiol (VIVELLE-DOT) 0.05 MG/24HR patch PLACE 1 PATCH  (0.05 MG TOTAL) ONTO THE SKIN 2 (TWO) TIMES A WEEK. 24 patch 0  ? Ibuprofen (ADVIL PO) Take by mouth.    ? Lactobacillus (PROBIOTIC ACIDOPHILUS PO) Take by mouth.    ? Turmeric (QC TUMERIC COMPLEX PO) Take by mouth.    ? ?No current facility-administered medications for this visit.  ? ? ?Family History  ?Problem Relation Age of Onset  ? Hypertension Mother   ? Arthritis Mother   ? Depression Mother   ? Hyperlipidemia Mother   ? Diabetes Sister   ? Mental illness Sister   ? Depression Brother   ? Anxiety disorder Brother   ? Breast cancer Maternal Aunt   ? Colon cancer Neg Hx   ? Colon polyps Neg Hx   ? Esophageal cancer Neg Hx   ? Rectal cancer Neg Hx   ? Stomach cancer Neg Hx   ? ? ?Review of Systems  ?All other systems reviewed and are negative. ? ?Exam:   ?BP 110/70   Pulse (!) 59   Ht 5\' 8"  (1.727 m)   Wt 146 lb (66.2 kg)   LMP  (LMP Unknown)   SpO2 99%   BMI 22.20 kg/m?   Weight change: @WEIGHTCHANGE @ Height:   Height: 5\' 8"  (172.7 cm)  ?Ht Readings from Last 3 Encounters:  ?03/10/22 5\' 8"  (1.727  m)  ?02/15/22 5\' 8"  (1.727 m)  ?10/28/21 5\' 8"  (1.727 m)  ? ? ?General appearance: alert, cooperative and appears stated age ?Head: Normocephalic, without obvious abnormality, atraumatic ?Neck: no adenopathy, supple, symmetrical, trachea midline and thyroid normal to inspection and palpation ?Lungs: clear to auscultation bilaterally ?Cardiovascular: regular rate and rhythm ?Breasts: normal appearance, no masses or tenderness ?Abdomen: soft, non-tender; non distended,  no masses,  no organomegaly ?Extremities: extremities normal, atraumatic, no cyanosis or edema ?Skin: Skin color, texture, turgor normal. No rashes or lesions ?Lymph nodes: Cervical, supraclavicular, and axillary nodes normal. ?No abnormal inguinal nodes palpated ?Neurologic: Grossly normal ? ? ?Pelvic: External genitalia:  no lesions ?             Urethra:  normal appearing urethra with no masses, tenderness or lesions ?             Bartholins and  Skenes: normal    ?             Vagina: normal appearing vagina with normal color and discharge, no lesions ?             Cervix: absent ?              ?Bimanual Exam:  Uterus:  uterus absent ?             Adnexa: no mass, fullness, tenderness ?              Rectovaginal: Confirms ?              Anus:  normal sphincter tone, no lesions ? ?Gae Dry chaperoned for the exam. ? ?1. Well woman exam ?Discussed breast self exam ?Discussed calcium and vit D intake ?Mammogram and colonoscopy are UTD ? ?2. Laboratory exam ordered as part of routine general medical examination ?- CBC ?- Comprehensive metabolic panel ?- Lipid panel ? ?3. Vitamin D deficiency ?- VITAMIN D 25 Hydroxy (Vit-D Deficiency, Fractures) ? ?4. Other fatigue ?- CBC ?- TSH ? ?5. Encounter for monitoring postmenopausal estrogen replacement therapy ?Doing well, wants to continue ?- estradiol (VIVELLE-DOT) 0.05 MG/24HR patch; Place 1 patch (0.05 mg total) onto the skin 2 (two) times a week.  Dispense: 24 patch; Refill: 3 ? ? ?

## 2022-03-10 ENCOUNTER — Ambulatory Visit (INDEPENDENT_AMBULATORY_CARE_PROVIDER_SITE_OTHER): Payer: BC Managed Care – PPO | Admitting: Obstetrics and Gynecology

## 2022-03-10 ENCOUNTER — Encounter: Payer: Self-pay | Admitting: Obstetrics and Gynecology

## 2022-03-10 VITALS — BP 110/70 | HR 59 | Ht 68.0 in | Wt 146.0 lb

## 2022-03-10 DIAGNOSIS — Z79890 Hormone replacement therapy (postmenopausal): Secondary | ICD-10-CM

## 2022-03-10 DIAGNOSIS — Z0000 Encounter for general adult medical examination without abnormal findings: Secondary | ICD-10-CM | POA: Diagnosis not present

## 2022-03-10 DIAGNOSIS — Z5181 Encounter for therapeutic drug level monitoring: Secondary | ICD-10-CM

## 2022-03-10 DIAGNOSIS — E559 Vitamin D deficiency, unspecified: Secondary | ICD-10-CM | POA: Diagnosis not present

## 2022-03-10 DIAGNOSIS — Z01419 Encounter for gynecological examination (general) (routine) without abnormal findings: Secondary | ICD-10-CM | POA: Diagnosis not present

## 2022-03-10 DIAGNOSIS — R5383 Other fatigue: Secondary | ICD-10-CM

## 2022-03-10 MED ORDER — ESTRADIOL 0.05 MG/24HR TD PTTW: 1.0000 | MEDICATED_PATCH | TRANSDERMAL | 3 refills | Status: DC

## 2022-03-11 LAB — TSH: TSH: 1.72 mIU/L

## 2022-03-11 LAB — CBC
HCT: 39.3 % (ref 35.0–45.0)
Hemoglobin: 13 g/dL (ref 11.7–15.5)
MCH: 30.2 pg (ref 27.0–33.0)
MCHC: 33.1 g/dL (ref 32.0–36.0)
MCV: 91.2 fL (ref 80.0–100.0)
MPV: 13.1 fL — ABNORMAL HIGH (ref 7.5–12.5)
Platelets: 192 10*3/uL (ref 140–400)
RBC: 4.31 10*6/uL (ref 3.80–5.10)
RDW: 12.9 % (ref 11.0–15.0)
WBC: 6.3 10*3/uL (ref 3.8–10.8)

## 2022-03-11 LAB — LIPID PANEL
Cholesterol: 167 mg/dL (ref <200)
HDL: 61 mg/dL (ref >=50)
LDL Cholesterol (Calc): 91 mg/dL (calc)
Non-HDL Cholesterol (Calc): 106 mg/dL (calc) (ref <130)
Total CHOL/HDL Ratio: 2.7 (calc) (ref <5.0)
Triglycerides: 65 mg/dL (ref <150)

## 2022-03-11 LAB — COMPREHENSIVE METABOLIC PANEL
AG Ratio: 1.9 (calc) (ref 1.0–2.5)
ALT: 15 U/L (ref 6–29)
AST: 22 U/L (ref 10–35)
Albumin: 4.4 g/dL (ref 3.6–5.1)
Alkaline phosphatase (APISO): 50 U/L (ref 37–153)
BUN: 14 mg/dL (ref 7–25)
CO2: 28 mmol/L (ref 20–32)
Calcium: 9.2 mg/dL (ref 8.6–10.4)
Chloride: 102 mmol/L (ref 98–110)
Creat: 0.53 mg/dL (ref 0.50–1.03)
Globulin: 2.3 g/dL (calc) (ref 1.9–3.7)
Glucose, Bld: 72 mg/dL (ref 65–99)
Potassium: 4.3 mmol/L (ref 3.5–5.3)
Sodium: 139 mmol/L (ref 135–146)
Total Bilirubin: 0.5 mg/dL (ref 0.2–1.2)
Total Protein: 6.7 g/dL (ref 6.1–8.1)

## 2022-03-11 LAB — VITAMIN D 25 HYDROXY (VIT D DEFICIENCY, FRACTURES): Vit D, 25-Hydroxy: 37 ng/mL (ref 30–100)

## 2022-04-28 ENCOUNTER — Telehealth: Payer: Self-pay | Admitting: Physician Assistant

## 2022-04-28 NOTE — Telephone Encounter (Signed)
Natasha Howell with [facility] ph# [phone] called to request the Order for Patient's Bone Density Scan that is in Epic be signed. Patient's Bone Density Appointment is scheduled for tomorrow 04/29/22 at 11 am.

## 2022-04-29 ENCOUNTER — Other Ambulatory Visit: Payer: Self-pay

## 2022-04-29 ENCOUNTER — Ambulatory Visit
Admission: RE | Admit: 2022-04-29 | Discharge: 2022-04-29 | Disposition: A | Discharge status: Home / Self Care | Payer: BC Managed Care – PPO | Source: Ambulatory Visit | Attending: Physician Assistant | Admitting: Physician Assistant

## 2022-04-29 DIAGNOSIS — Z79890 Hormone replacement therapy (postmenopausal): Secondary | ICD-10-CM

## 2022-04-29 DIAGNOSIS — Z780 Asymptomatic menopausal state: Secondary | ICD-10-CM

## 2022-04-29 NOTE — Telephone Encounter (Signed)
Orders are in Epic and signed for pt Bone density scan; attempted to contact [facility] but unable to reach 

## 2022-06-13 IMAGING — MG DIGITAL DIAGNOSTIC BILAT W/ TOMO W/ CAD
8 series · 9 of 24 positions shown · non-contrast
Comparison: Previous exam(s).

CLINICAL DATA: Patient complains of tingling behind the left
nipple.

EXAM:
DIGITAL DIAGNOSTIC BILATERAL MAMMOGRAM WITH CAD AND TOMOSYNTHESIS
ULTRASOUND LEFT BREAST
TECHNIQUE: Bilateral digital diagnostic mammography and breast tomosynthesis
was performed. Digital images of the breasts were evaluated with
computer-aided detection. Targeted ultrasound examination of the
Left breast was performed.

[L MLO synth-2D]
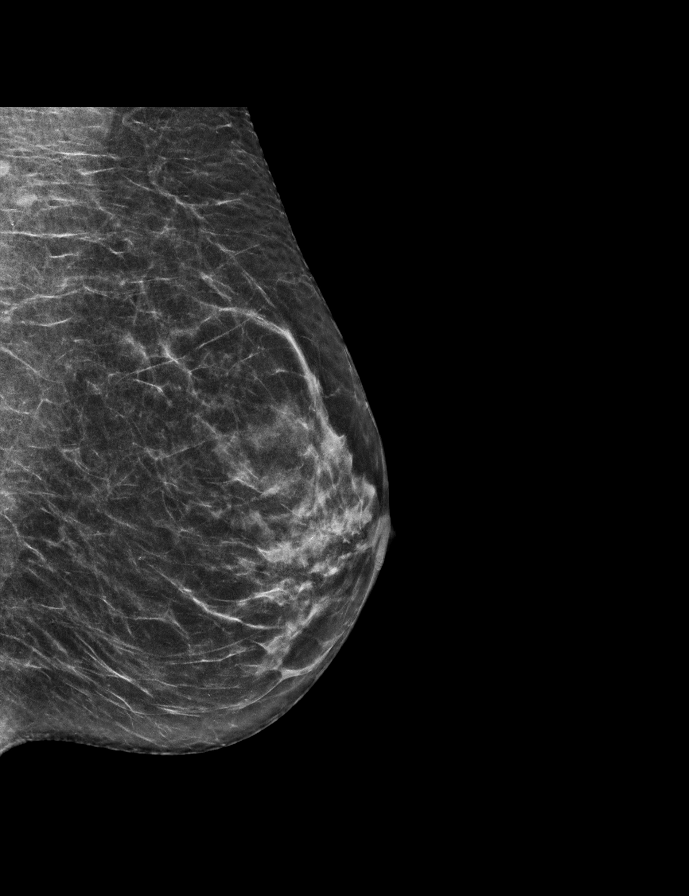

[R CC synth-2D]
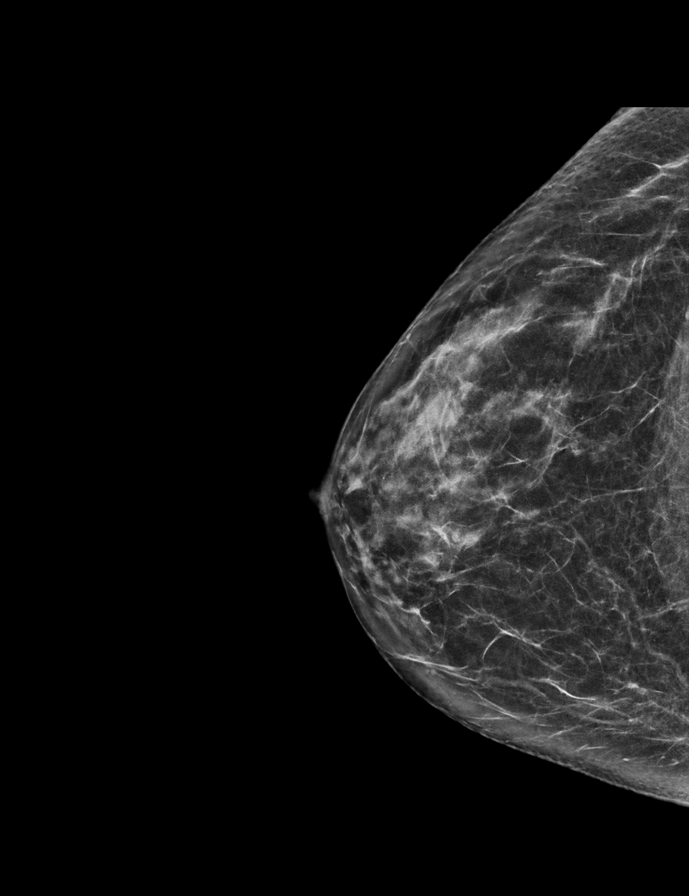

[R MLO synth-2D]
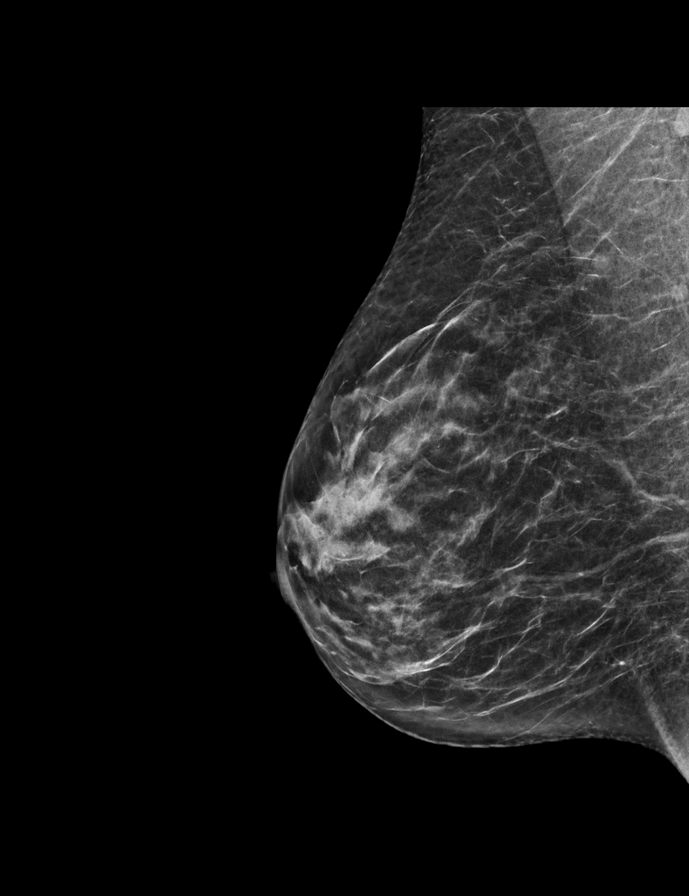

[L CC synth-2D]
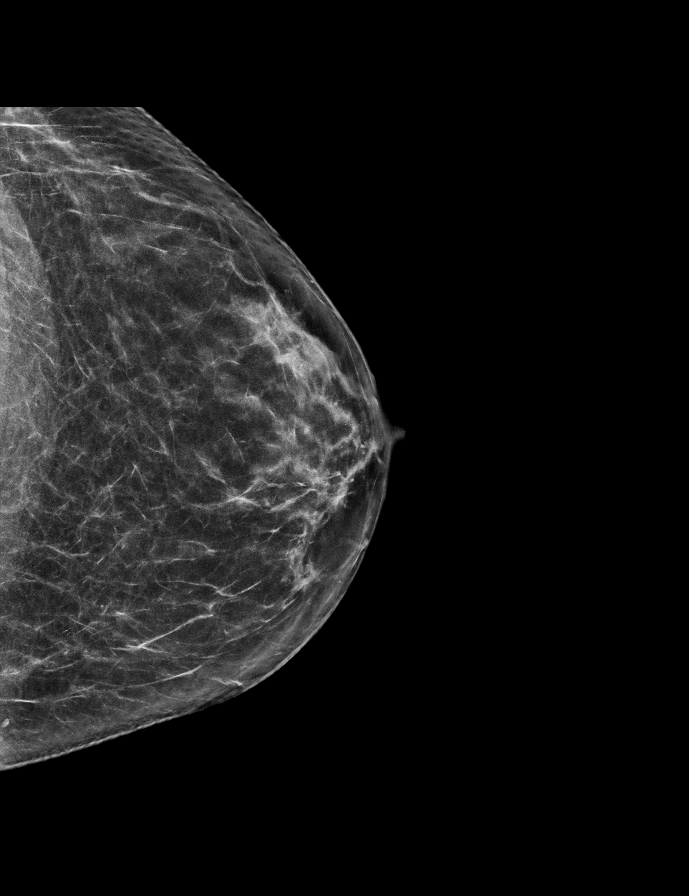

[L MLO tomo · 2 of 56 frames shown]
[frame 19/56]
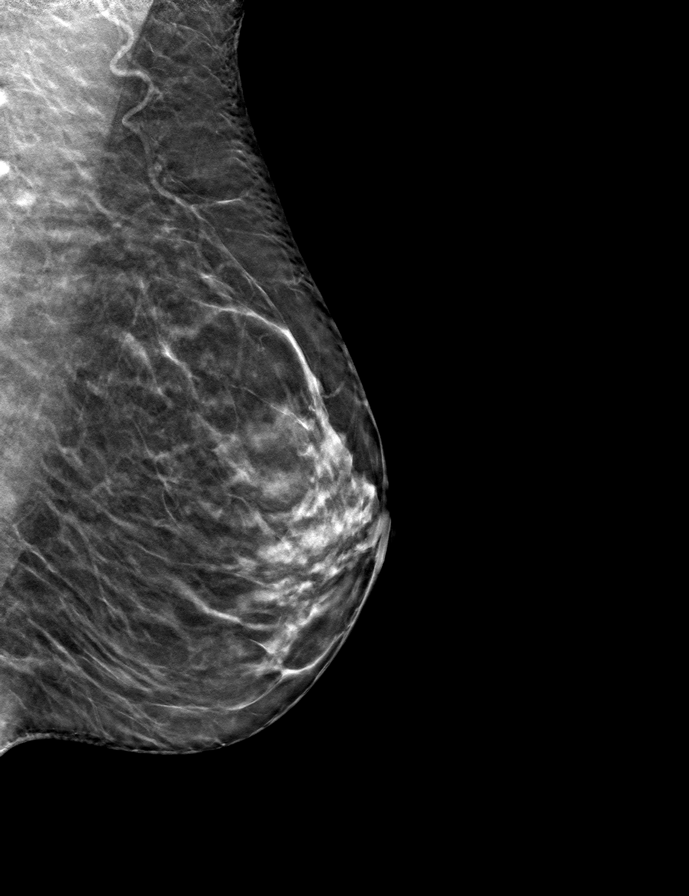
[frame 29/56]
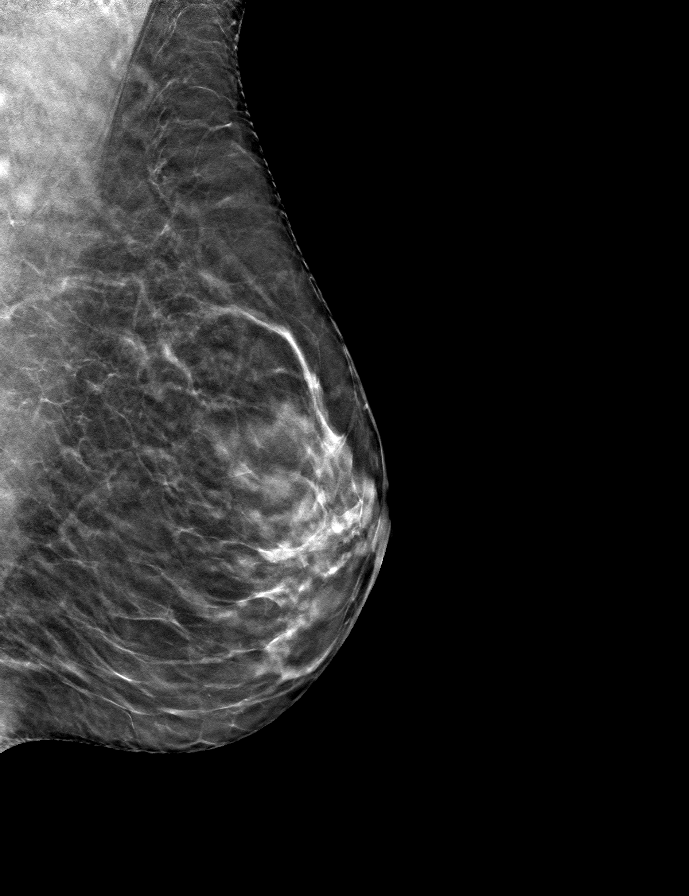

[R CC tomo · tomo slice 29/58.0]
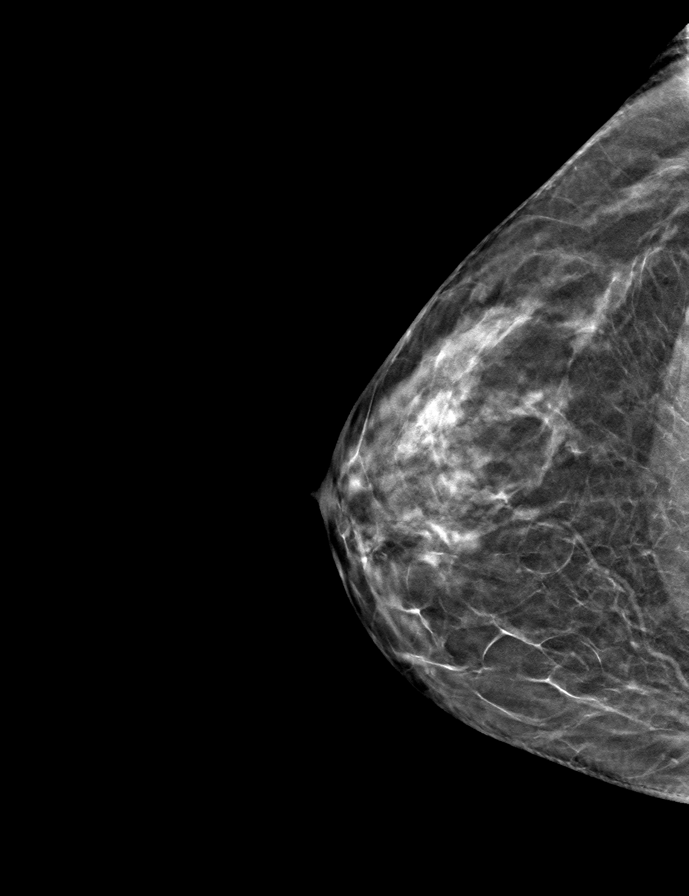

[L CC tomo · tomo slice 31/60.0]
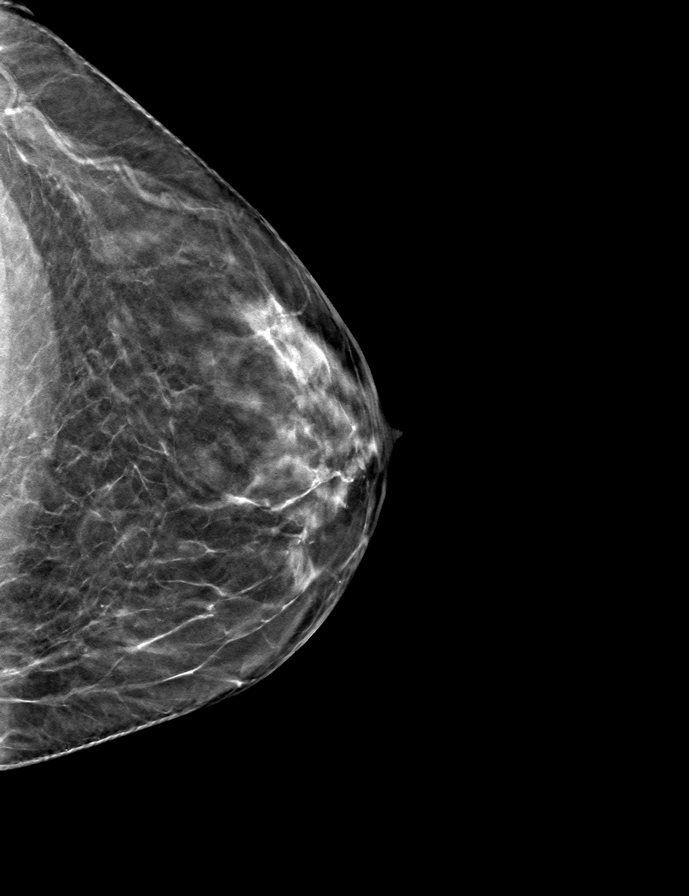

[R MLO tomo · tomo slice 29/56.0]
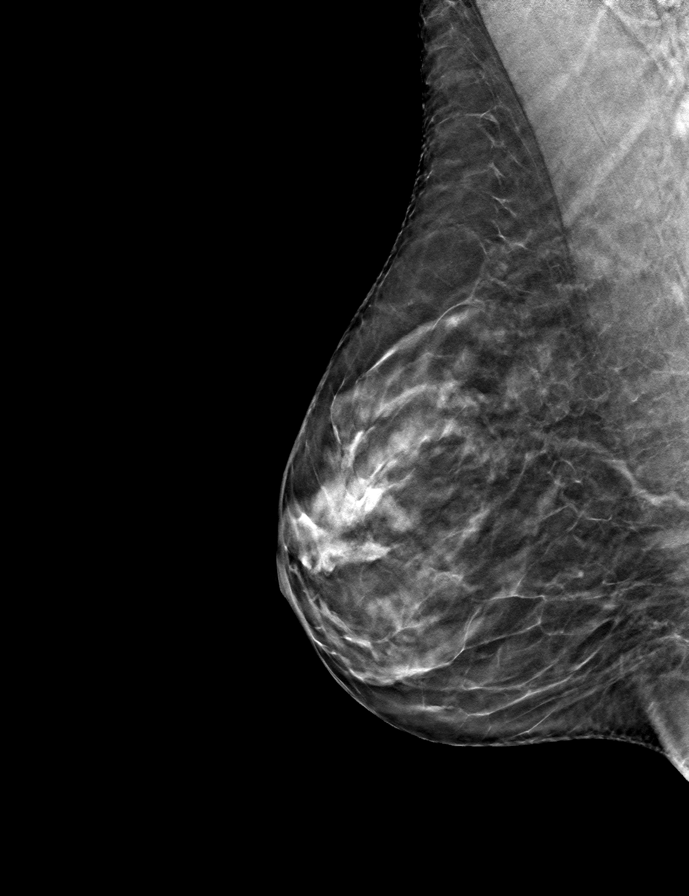

[9 of 24 positions shown; findings below may reference images not displayed]

ACR Breast Density Category c: The breast tissue is heterogeneously
dense, which may obscure small masses.
FINDINGS: No suspicious mass, malignant type microcalcifications or distortion
detected in either breast.

Targeted ultrasound is performed, showing normal tissue in the
retroareolar region of the left breast.
IMPRESSION: No evidence of malignancy in either breast.

RECOMMENDATION:
Bilateral screening mammogram in 1 year is recommended.

I have discussed the findings and recommendations with the patient.
If applicable, a reminder letter will be sent to the patient
regarding the next appointment.

BI-RADS CATEGORY  1: Negative.

## 2022-06-14 ENCOUNTER — Telehealth: Payer: BC Managed Care – PPO | Admitting: Physician Assistant

## 2022-06-14 DIAGNOSIS — J019 Acute sinusitis, unspecified: Secondary | ICD-10-CM

## 2022-06-14 DIAGNOSIS — B9689 Other specified bacterial agents as the cause of diseases classified elsewhere: Secondary | ICD-10-CM

## 2022-06-14 MED ORDER — AMOXICILLIN-POT CLAVULANATE 875-125 MG PO TABS: 1.0000 | ORAL_TABLET | Freq: Two times a day (BID) | ORAL | 0 refills | Status: DC

## 2022-06-14 MED ORDER — PREDNISONE 20 MG PO TABS: 40.0000 mg | ORAL_TABLET | Freq: Every day | ORAL | 0 refills | Status: DC

## 2022-06-14 NOTE — Progress Notes (Signed)
Virtual Visit Consent   Natasha Howell, you are scheduled for a virtual visit with a [facility] provider today. Just as with appointments in the office, your consent must be obtained to participate. Your consent will be active for this visit and any virtual visit you may have with one of our providers in the next 365 days. If you have a MyChart account, a copy of this consent can be sent to you electronically.  As this is a virtual visit, video technology does not allow for your provider to perform a traditional examination. This may limit your provider's ability to fully assess your condition. If your provider identifies any concerns that need to be evaluated in person or the need to arrange testing (such as labs, EKG, etc.), we will make arrangements to do so. Although advances in technology are sophisticated, we cannot ensure that it will always work on either your end or our end. If the connection with a video visit is poor, the visit may have to be switched to a telephone visit. With either a video or telephone visit, we are not always able to ensure that we have a secure connection.  By engaging in this virtual visit, you consent to the provision of healthcare and authorize for your insurance to be billed (if applicable) for the services provided during this visit. Depending on your insurance coverage, you may receive a charge related to this service.  I need to obtain your verbal consent now. Are you willing to proceed with your visit today? Natasha Howell has provided verbal consent on 06/14/2022 for a virtual visit (video or telephone). Margaretann Loveless, PA-C  Date: 06/14/2022 2:26 PM  Virtual Visit via Video Note   I, Margaretann Loveless, connected with  Natasha Howell  ([id number], 12-04-1965) on 06/14/22 at  2:15 PM EDT by a video-enabled telemedicine application and verified that I am speaking with the correct person using two identifiers.  Location: Patient: Virtual  Visit Location Patient: Home Provider: Virtual Visit Location Provider: Home Office   I discussed the limitations of evaluation and management by telemedicine and the availability of in person appointments. The patient expressed understanding and agreed to proceed.    History of Present Illness: Natasha Howell is a 56 y.o. who identifies as a female who was assigned female at birth, and is being seen today for possible sinus infection.  HPI: Sinusitis This is a new problem. The current episode started 1 to 4 weeks ago (3 weeks). The problem has been gradually worsening since onset. There has been no fever. Associated symptoms include congestion, coughing, ear pain, headaches and sinus pressure. Pertinent negatives include no shortness of breath or sore throat. Past treatments include nasal decongestants and oral decongestants (mucinex, sudafed, sinex). The treatment provided no relief.      Problems:  Patient Active Problem List   Diagnosis Date Noted   Vitiligo 08/25/2018   Situational anxiety 08/25/2018   Cardiac syncope 11/25/2017   Palpitations 11/25/2017    Allergies:  Allergies  Allergen Reactions   Morphine And Related Rash   Medications:  Current Outpatient Medications:    amoxicillin-clavulanate (AUGMENTIN) 875-125 MG tablet, Take 1 tablet by mouth 2 (two) times daily., Disp: 14 tablet, Rfl: 0   predniSONE (DELTASONE) 20 MG tablet, Take 2 tablets (40 mg total) by mouth daily with breakfast., Disp: 10 tablet, Rfl: 0   clonazePAM (KLONOPIN) 0.5 MG tablet, TAKE 1/2 TABLET AS NEEDED FOR TRAVEL, Disp: 20 tablet, Rfl: 0  estradiol (VIVELLE-DOT) 0.05 MG/24HR patch, Place 1 patch (0.05 mg total) onto the skin 2 (two) times a week., Disp: 24 patch, Rfl: 3   Ibuprofen (ADVIL PO), Take by mouth., Disp: , Rfl:    Lactobacillus (PROBIOTIC ACIDOPHILUS PO), Take by mouth., Disp: , Rfl:    Turmeric (QC TUMERIC COMPLEX PO), Take by mouth., Disp: , Rfl:    Observations/Objective: Patient is well-developed, well-nourished in no acute distress.  Resting comfortably at home.  Head is normocephalic, atraumatic.  No labored breathing.  Speech is clear and coherent with logical content.  Patient is alert and oriented at baseline.    Assessment and Plan: 1. Acute bacterial sinusitis - amoxicillin-clavulanate (AUGMENTIN) 875-125 MG tablet; Take 1 tablet by mouth 2 (two) times daily.  Dispense: 14 tablet; Refill: 0 - predniSONE (DELTASONE) 20 MG tablet; Take 2 tablets (40 mg total) by mouth daily with breakfast.  Dispense: 10 tablet; Refill: 0  - Worsening symptoms that have not responded to OTC medications.  - Will give Augmentin and Prednisone - Continue allergy medications.  - Steam and humidifier can help - Stay well hydrated and get plenty of rest.  - Seek in person evaluation if no symptom improvement or if symptoms worsen   Follow Up Instructions: I discussed the assessment and treatment plan with the patient. The patient was provided an opportunity to ask questions and all were answered. The patient agreed with the plan and demonstrated an understanding of the instructions.  A copy of instructions were sent to the patient via MyChart unless otherwise noted below.    The patient was advised to call back or seek an in-person evaluation if the symptoms worsen or if the condition fails to improve as anticipated.  Time:  I spent 8 minutes with the patient via telehealth technology discussing the above problems/concerns.    Margaretann Loveless, PA-C

## 2022-06-14 NOTE — Patient Instructions (Signed)
Natasha Howell, thank you for joining Margaretann Loveless, PA-C for today's virtual visit.  While this provider is not your primary care provider (PCP), if your PCP is located in our provider database this encounter information will be shared with them immediately following your visit.  Consent: (Patient) Natasha Howell provided verbal consent for this virtual visit at the beginning of the encounter.  Current Medications:  Current Outpatient Medications:    amoxicillin-clavulanate (AUGMENTIN) 875-125 MG tablet, Take 1 tablet by mouth 2 (two) times daily., Disp: 14 tablet, Rfl: 0   predniSONE (DELTASONE) 20 MG tablet, Take 2 tablets (40 mg total) by mouth daily with breakfast., Disp: 10 tablet, Rfl: 0   clonazePAM (KLONOPIN) 0.5 MG tablet, TAKE 1/2 TABLET AS NEEDED FOR TRAVEL, Disp: 20 tablet, Rfl: 0   estradiol (VIVELLE-DOT) 0.05 MG/24HR patch, Place 1 patch (0.05 mg total) onto the skin 2 (two) times a week., Disp: 24 patch, Rfl: 3   Ibuprofen (ADVIL PO), Take by mouth., Disp: , Rfl:    Lactobacillus (PROBIOTIC ACIDOPHILUS PO), Take by mouth., Disp: , Rfl:    Turmeric (QC TUMERIC COMPLEX PO), Take by mouth., Disp: , Rfl:    Medications ordered in this encounter:  Meds ordered this encounter  Medications   amoxicillin-clavulanate (AUGMENTIN) 875-125 MG tablet    Sig: Take 1 tablet by mouth 2 (two) times daily.    Dispense:  14 tablet    Refill:  0    Order Specific Question:   Supervising Provider    Answer:   Hyacinth Meeker, BRIAN [[id number]]   predniSONE (DELTASONE) 20 MG tablet    Sig: Take 2 tablets (40 mg total) by mouth daily with breakfast.    Dispense:  10 tablet    Refill:  0    Order Specific Question:   Supervising Provider    Answer:   Hyacinth Meeker, BRIAN [[id number]]     *If you need refills on other medications prior to your next appointment, please contact your pharmacy*  Follow-Up: Call back or seek an in-person evaluation if the symptoms worsen or if the condition fails to  improve as anticipated.  Other Instructions Sinus Infection, Adult A sinus infection, also called sinusitis, is inflammation of your sinuses. Sinuses are hollow spaces in the bones around your face. Your sinuses are located: Around your eyes. In the middle of your forehead. Behind your nose. In your cheekbones. Mucus normally drains out of your sinuses. When your nasal tissues become inflamed or swollen, mucus can become trapped or blocked. This allows bacteria, viruses, and fungi to grow, which leads to infection. Most infections of the sinuses are caused by a virus. A sinus infection can develop quickly. It can last for up to 4 weeks (acute) or for more than 12 weeks (chronic). A sinus infection often develops after a cold. What are the causes? This condition is caused by anything that creates swelling in the sinuses or stops mucus from draining. This includes: Allergies. Asthma. Infection from bacteria or viruses. Deformities or blockages in your nose or sinuses. Abnormal growths in the nose (nasal polyps). Pollutants, such as chemicals or irritants in the air. Infection from fungi. This is rare. What increases the risk? You are more likely to develop this condition if you: Have a weak body defense system (immune system). Do a lot of swimming or diving. Overuse nasal sprays. Smoke. What are the signs or symptoms? The main symptoms of this condition are pain and a feeling of pressure around the affected sinuses.  Other symptoms include: Stuffy nose or congestion that makes it difficult to breathe through your nose. Thick yellow or greenish drainage from your nose. Tenderness, swelling, and warmth over the affected sinuses. A cough that may get worse at night. Decreased sense of smell and taste. Extra mucus that collects in the throat or the back of the nose (postnasal drip) causing a sore throat or bad breath. Tiredness (fatigue). Fever. How is this diagnosed? This condition is  diagnosed based on: Your symptoms. Your medical history. A physical exam. Tests to find out if your condition is acute or chronic. This may include: Checking your nose for nasal polyps. Viewing your sinuses using a device that has a light (endoscope). Testing for allergies or bacteria. Imaging tests, such as an MRI or CT scan. In rare cases, a bone biopsy may be done to rule out more serious types of fungal sinus disease. How is this treated? Treatment for a sinus infection depends on the cause and whether your condition is chronic or acute. If caused by a virus, your symptoms should go away on their own within 10 days. You may be given medicines to relieve symptoms. They include: Medicines that shrink swollen nasal passages (decongestants). A spray that eases inflammation of the nostrils (topical intranasal corticosteroids). Rinses that help get rid of thick mucus in your nose (nasal saline washes). Medicines that treat allergies (antihistamines). Over-the-counter pain relievers. If caused by bacteria, your health care provider may recommend waiting to see if your symptoms improve. Most bacterial infections will get better without antibiotic medicine. You may be given antibiotics if you have: A severe infection. A weak immune system. If caused by narrow nasal passages or nasal polyps, surgery may be needed. Follow these instructions at home: Medicines Take, use, or apply over-the-counter and prescription medicines only as told by your health care provider. These may include nasal sprays. If you were prescribed an antibiotic medicine, take it as told by your health care provider. Do not stop taking the antibiotic even if you start to feel better. Hydrate and humidify  Drink enough fluid to keep your urine pale yellow. Staying hydrated will help to thin your mucus. Use a cool mist humidifier to keep the humidity level in your home above 50%. Inhale steam for 10-15 minutes, 3-4 times a  day, or as told by your health care provider. You can do this in the bathroom while a hot shower is running. Limit your exposure to cool or dry air. Rest Rest as much as possible. Sleep with your head raised (elevated). Make sure you get enough sleep each night. General instructions  Apply a warm, moist washcloth to your face 3-4 times a day or as told by your health care provider. This will help with discomfort. Use nasal saline washes as often as told by your health care provider. Wash your hands often with soap and water to reduce your exposure to germs. If soap and water are not available, use hand sanitizer. Do not smoke. Avoid being around people who are smoking (secondhand smoke). Keep all follow-up visits. This is important. Contact a health care provider if: You have a fever. Your symptoms get worse. Your symptoms do not improve within 10 days. Get help right away if: You have a severe headache. You have persistent vomiting. You have severe pain or swelling around your face or eyes. You have vision problems. You develop confusion. Your neck is stiff. You have trouble breathing. These symptoms may be an emergency. Get help  right away. Call 911. Do not wait to see if the symptoms will go away. Do not drive yourself to the hospital. Summary A sinus infection is soreness and inflammation of your sinuses. Sinuses are hollow spaces in the bones around your face. This condition is caused by nasal tissues that become inflamed or swollen. The swelling traps or blocks the flow of mucus. This allows bacteria, viruses, and fungi to grow, which leads to infection. If you were prescribed an antibiotic medicine, take it as told by your health care provider. Do not stop taking the antibiotic even if you start to feel better. Keep all follow-up visits. This is important. This information is not intended to replace advice given to you by your health care provider. Make sure you discuss any  questions you have with your health care provider. Document Revised: 09/29/2021 Document Reviewed: 09/29/2021 Elsevier Patient Education  2023 Elsevier Inc.    If you have been instructed to have an in-person evaluation today at a local Urgent Care facility, please use the link below. It will take you to a list of all of our available [facility], including address, phone number and hours of operation. Please do not delay care.  [facility]  If you or a family member do not have a primary care provider, use the link below to schedule a visit and establish care. When you choose a [facility] primary care physician or advanced practice provider, you gain a long-term partner in health. Find a Primary Care Provider  Learn more about [facility] in-office and virtual care options: [facility] - Get Care Now

## 2022-10-21 ENCOUNTER — Encounter: Payer: Self-pay | Admitting: *Deleted

## 2022-11-03 ENCOUNTER — Encounter: Payer: Self-pay | Admitting: Physician Assistant

## 2022-11-03 ENCOUNTER — Ambulatory Visit (INDEPENDENT_AMBULATORY_CARE_PROVIDER_SITE_OTHER): Payer: BC Managed Care – PPO | Admitting: Physician Assistant

## 2022-11-03 VITALS — BP 100/63 | HR 57 | Temp 97.5°F | Ht 68.0 in | Wt 149.2 lb

## 2022-11-03 DIAGNOSIS — Z0000 Encounter for general adult medical examination without abnormal findings: Secondary | ICD-10-CM | POA: Diagnosis not present

## 2022-11-03 DIAGNOSIS — F418 Other specified anxiety disorders: Secondary | ICD-10-CM | POA: Diagnosis not present

## 2022-11-03 DIAGNOSIS — M25551 Pain in right hip: Secondary | ICD-10-CM

## 2022-11-03 NOTE — Assessment & Plan Note (Addendum)
Age-appropriate screening and counseling performed today. Labs done in April, reviewed today, normal, no concerns. Recheck next year.  Preventive measures discussed and printed in AVS for patient.   Patient Counseling: [x]   Nutrition: Stressed importance of moderation in sodium/caffeine intake, saturated fat and cholesterol, caloric balance, sufficient intake of fresh fruits, vegetables, and fiber.  [x]   Stressed the importance of regular exercise.   []   Substance Abuse: Discussed cessation/primary prevention of tobacco, alcohol, or other drug use; driving or other dangerous activities under the influence; availability of treatment for abuse.   []   Injury prevention: Discussed safety belts, safety helmets, smoke detector, smoking near bedding or upholstery.   []   Sexuality: Discussed sexually transmitted diseases, partner selection, use of condoms, avoidance of unintended pregnancy  and contraceptive alternatives.   [x]   Dental health: Discussed importance of regular tooth brushing, flossing, and dental visits.  [x]   Health maintenance and immunizations reviewed. Please refer to Health maintenance section.

## 2022-11-03 NOTE — Progress Notes (Signed)
Subjective:    Patient ID: Natasha Howell, female    DOB: 06-28-1966, 56 y.o.   MRN: [id number]  Chief Complaint  Patient presents with   Annual Exam    Pt has no questions or concerns     HPI Patient is in today for annual exam. No major health changes.   Acute concerns: Anterior R hip pain, waking her up at night sometimes, newer ache in the last few months. Walking more during the day tends to aggravate it at night.   Health maintenance: Lifestyle/ exercise: Walking, swimming Nutrition: Doing well  Mental health: No concerns Sleep: Doing well, estradiol patch helping  Substance use: None Immunizations: Flu and COVID booster done the end of October at [facility]  Colonoscopy: UTD 12/17/20  Pap: UTD with GYN Mammogram: UTD with GYN  Dermatology annually - will go next month    Past Medical History:  Diagnosis Date   Allergy    Anxiety    GERD (gastroesophageal reflux disease)    Heart murmur    Heard on occasion-is not under treatment   History of chicken pox    History of fainting spells of unknown cause    History of recurrent UTIs    Vitiligo     Past Surgical History:  Procedure Laterality Date   TOTAL VAGINAL HYSTERECTOMY  2001   with unilateral salpingo-oophorectomy   WISDOM TOOTH EXTRACTION      Family History  Problem Relation Age of Onset   Hypertension Mother    Arthritis Mother    Depression Mother    Hyperlipidemia Mother    Diabetes Sister    Mental illness Sister    Depression Brother    Anxiety disorder Brother    Breast cancer Maternal Aunt    Colon cancer Neg Hx    Colon polyps Neg Hx    Esophageal cancer Neg Hx    Rectal cancer Neg Hx    Stomach cancer Neg Hx     Social History   Tobacco Use   Smoking status: Never   Smokeless tobacco: Never  Vaping Use   Vaping Use: Never used  Substance Use Topics   Alcohol use: Yes    Alcohol/week: 7.0 standard drinks of alcohol    Types: 7 Glasses of wine per week   Drug use: No      Allergies  Allergen Reactions   Morphine And Related Rash    Review of Systems NEGATIVE UNLESS OTHERWISE INDICATED IN HPI      Objective:     BP 100/63 (BP Location: Left Arm, Patient Position: Sitting)   Pulse (!) 57   Temp (!) 97.5 F (36.4 C) (Temporal)   Ht 5\' 8"  (1.727 m)   Wt 149 lb 3.2 oz (67.7 kg)   LMP  (LMP Unknown)   SpO2 100%   BMI 22.69 kg/m   Wt Readings from Last 3 Encounters:  11/03/22 149 lb 3.2 oz (67.7 kg)  03/10/22 146 lb (66.2 kg)  02/15/22 144 lb (65.3 kg)    BP Readings from Last 3 Encounters:  11/03/22 100/63  03/10/22 110/70  02/15/22 118/68     Physical Exam Vitals and nursing note reviewed.  Constitutional:      Appearance: Normal appearance. She is normal weight. She is not toxic-appearing.  HENT:     Head: Normocephalic and atraumatic.     Right Ear: Tympanic membrane, ear canal and external ear normal.     Left Ear: Tympanic membrane, ear canal and  external ear normal.     Nose: Nose normal.     Mouth/Throat:     Mouth: Mucous membranes are moist.  Eyes:     Extraocular Movements: Extraocular movements intact.     Conjunctiva/sclera: Conjunctivae normal.     Pupils: Pupils are equal, round, and reactive to light.  Cardiovascular:     Rate and Rhythm: Normal rate and regular rhythm.     Pulses: Normal pulses.     Heart sounds: Normal heart sounds.  Pulmonary:     Effort: Pulmonary effort is normal.     Breath sounds: Normal breath sounds.  Abdominal:     General: Abdomen is flat. Bowel sounds are normal.     Palpations: Abdomen is soft.  Musculoskeletal:        General: Normal range of motion.     Cervical back: Normal range of motion and neck supple.  Skin:    General: Skin is warm and dry.     Comments: Vitiligo noted on chest  Neurological:     General: No focal deficit present.     Mental Status: She is alert and oriented to person, place, and time.  Psychiatric:        Mood and Affect: Mood normal.         Behavior: Behavior normal.        Thought Content: Thought content normal.        Judgment: Judgment normal.        Assessment & Plan:  Encounter for annual physical exam Assessment & Plan: Age-appropriate screening and counseling performed today. Labs done in April, reviewed today, normal, no concerns. Recheck next year.  Preventive measures discussed and printed in AVS for patient.   Patient Counseling: [x]   Nutrition: Stressed importance of moderation in sodium/caffeine intake, saturated fat and cholesterol, caloric balance, sufficient intake of fresh fruits, vegetables, and fiber.  [x]   Stressed the importance of regular exercise.   []   Substance Abuse: Discussed cessation/primary prevention of tobacco, alcohol, or other drug use; driving or other dangerous activities under the influence; availability of treatment for abuse.   []   Injury prevention: Discussed safety belts, safety helmets, smoke detector, smoking near bedding or upholstery.   []   Sexuality: Discussed sexually transmitted diseases, partner selection, use of condoms, avoidance of unintended pregnancy  and contraceptive alternatives.   [x]   Dental health: Discussed importance of regular tooth brushing, flossing, and dental visits.  [x]   Health maintenance and immunizations reviewed. Please refer to Health maintenance section.       Situational anxiety Assessment & Plan: Klonopin very rare as needed for travel. Will call for refills if needed.    Right hip pain  -Offered XRAY for R hip pain. States she'll let me know if any worse or changes; will monitor at this time.     Return in about 1 year (around 11/04/2023) for CPE, fasting labs .    Natasha Lowrimore M Antavious Spanos, PA-C

## 2022-11-03 NOTE — Patient Instructions (Signed)
Keep up the good work! Call for refills. Let me know if needing any additional help with Right hip.

## 2022-11-03 NOTE — Assessment & Plan Note (Signed)
Klonopin very rare as needed for travel. Will call for refills if needed.

## 2022-11-11 ENCOUNTER — Other Ambulatory Visit: Payer: Self-pay | Admitting: Physician Assistant

## 2022-11-11 DIAGNOSIS — Z1231 Encounter for screening mammogram for malignant neoplasm of breast: Secondary | ICD-10-CM

## 2022-12-31 ENCOUNTER — Ambulatory Visit
Admission: RE | Admit: 2022-12-31 | Discharge: 2022-12-31 | Disposition: A | Discharge status: Home / Self Care | Payer: BC Managed Care – PPO | Source: Ambulatory Visit | Attending: Physician Assistant | Admitting: Physician Assistant

## 2022-12-31 DIAGNOSIS — Z1231 Encounter for screening mammogram for malignant neoplasm of breast: Secondary | ICD-10-CM

## 2023-03-08 NOTE — Progress Notes (Deleted)
57 y.o. G15P2002 Married White or Caucasian Not Hispanic or Latino female here for annual exam.      No LMP recorded.          Sexually active: {yes no:314532}  The current method of family planning is status post hysterectomy.    Exercising: {yes no:314532}  {types:19826} Smoker:  {YES [id number]  Health Maintenance: Pap:  04/03/15 WNL hr hpv neg,  History of abnormal Pap:  yes repeat was normal  MMG:  01/04/23 Bi-rads 1 neg  BMD:   04/29/22 normal  Colonoscopy: 12/17/20 normal f/u 10 years TDaP:  12/12/13  Gardasil: n/a   reports that she has never smoked. She has never used smokeless tobacco. She reports current alcohol use of about 7.0 standard drinks of alcohol per week. She reports that she does not use drugs.  Past Medical History:  Diagnosis Date   Allergy    Anxiety    GERD (gastroesophageal reflux disease)    Heart murmur    Heard on occasion-is not under treatment   History of chicken pox    History of fainting spells of unknown cause    History of recurrent UTIs    Vitiligo     Past Surgical History:  Procedure Laterality Date   TOTAL VAGINAL HYSTERECTOMY  2001   with unilateral salpingo-oophorectomy   WISDOM TOOTH EXTRACTION      Current Outpatient Medications  Medication Sig Dispense Refill   clonazePAM (KLONOPIN) 0.5 MG tablet TAKE 1/2 TABLET AS NEEDED FOR TRAVEL 20 tablet 0   estradiol (VIVELLE-DOT) 0.05 MG/24HR patch Place 1 patch (0.05 mg total) onto the skin 2 (two) times a week. 24 patch 3   Ibuprofen (ADVIL PO) Take by mouth.     Lactobacillus (PROBIOTIC ACIDOPHILUS PO) Take by mouth.     Turmeric (QC TUMERIC COMPLEX PO) Take by mouth.     No current facility-administered medications for this visit.    Family History  Problem Relation Age of Onset   Hypertension Mother    Arthritis Mother    Depression Mother    Hyperlipidemia Mother    Diabetes Sister    Mental illness Sister    Depression Brother    Anxiety disorder Brother    Breast cancer  Maternal Aunt    Colon cancer Neg Hx    Colon polyps Neg Hx    Esophageal cancer Neg Hx    Rectal cancer Neg Hx    Stomach cancer Neg Hx     Review of Systems  Exam:   There were no vitals taken for this visit.  Weight change: @WEIGHTCHANGE @ Height:      Ht Readings from Last 3 Encounters:  11/03/22 5\' 8"  (1.727 m)  03/10/22 5\' 8"  (1.727 m)  02/15/22 5\' 8"  (1.727 m)    General appearance: alert, cooperative and appears stated age Head: Normocephalic, without obvious abnormality, atraumatic Neck: no adenopathy, supple, symmetrical, trachea midline and thyroid {CHL AMB PHY EX THYROID NORM DEFAULT:[phone]::"normal to inspection and palpation"} Lungs: clear to auscultation bilaterally Cardiovascular: regular rate and rhythm Breasts: {Exam; breast:13139::"normal appearance, no masses or tenderness"} Abdomen: soft, non-tender; non distended,  no masses,  no organomegaly Extremities: extremities normal, atraumatic, no cyanosis or edema Skin: Skin color, texture, turgor normal. No rashes or lesions Lymph nodes: Cervical, supraclavicular, and axillary nodes normal. No abnormal inguinal nodes palpated Neurologic: Grossly normal   Pelvic: External genitalia:  no lesions              Urethra:  normal  appearing urethra with no masses, tenderness or lesions              Bartholins and Skenes: normal                 Vagina: normal appearing vagina with normal color and discharge, no lesions              Cervix: {CHL AMB PHY EX CERVIX NORM DEFAULT:[phone]::"no lesions"}               Bimanual Exam:  Uterus:  {CHL AMB PHY EX UTERUS NORM [phone]::"normal size, contour, position, consistency, mobility, non-tender"}              Adnexa: {CHL AMB PHY EX ADNEXA NO MASS DEFAULT:[phone]::"no mass, fullness, tenderness"}               Rectovaginal: Confirms               Anus:  normal sphincter tone, no lesions  *** chaperoned for the exam.  A:  Well Woman with normal  exam  P:

## 2023-03-14 ENCOUNTER — Ambulatory Visit: Payer: BC Managed Care – PPO | Admitting: Obstetrics and Gynecology

## 2023-03-22 ENCOUNTER — Ambulatory Visit: Payer: BC Managed Care – PPO | Admitting: Obstetrics and Gynecology

## 2023-03-23 ENCOUNTER — Encounter: Payer: Self-pay | Admitting: Nurse Practitioner

## 2023-03-23 ENCOUNTER — Other Ambulatory Visit: Payer: Self-pay | Admitting: Physician Assistant

## 2023-03-23 DIAGNOSIS — Z5181 Encounter for therapeutic drug level monitoring: Secondary | ICD-10-CM

## 2023-03-23 MED ORDER — ESTRADIOL 0.05 MG/24HR TD PTTW: 1.0000 | MEDICATED_PATCH | TRANSDERMAL | 0 refills | Status: DC

## 2023-03-23 MED ORDER — CLONAZEPAM 0.5 MG PO TABS: ORAL_TABLET | ORAL | 0 refills | Status: DC

## 2023-03-23 NOTE — Telephone Encounter (Signed)
Last AEX 03/10/2022--now scheduled w/ TW on 05/31/2023. Last mammo 12/31/2022-neg birads 1  Rx pend.

## 2023-05-31 ENCOUNTER — Ambulatory Visit (INDEPENDENT_AMBULATORY_CARE_PROVIDER_SITE_OTHER): Payer: BC Managed Care – PPO | Admitting: Nurse Practitioner

## 2023-05-31 ENCOUNTER — Encounter: Payer: Self-pay | Admitting: Nurse Practitioner

## 2023-05-31 VITALS — BP 118/74 | HR 62 | Ht 67.0 in | Wt 150.6 lb

## 2023-05-31 DIAGNOSIS — Z79890 Hormone replacement therapy (postmenopausal): Secondary | ICD-10-CM | POA: Diagnosis not present

## 2023-05-31 DIAGNOSIS — Z01419 Encounter for gynecological examination (general) (routine) without abnormal findings: Secondary | ICD-10-CM

## 2023-05-31 MED ORDER — ESTRADIOL 0.05 MG/24HR TD PTTW: 1.0000 | MEDICATED_PATCH | TRANSDERMAL | 3 refills | Status: DC

## 2023-05-31 NOTE — Progress Notes (Signed)
   Natasha Howell November 22, 1965 [id number]   History:  57 y.o. G2P2002 presents for annual exam. Postmenopausal - on ERT. Started ERT 09/2019 for vasomotor symptoms with good management. S/P 2001 TAH and unilateral salpingo-oophorectomy in her mid 30's for a dermoid cyst. Sexually active, no dyspareunia. Abnormal pap years ago, repeat normal.   Gynecologic History No LMP recorded (lmp unknown). Patient has had a hysterectomy.   Contraception/Family planning: status post hysterectomy Sexually active: Yes  Health Maintenance Last Pap: 04/03/2015. Results were: Normal neg HPV Last mammogram: 12/31/2022. Results were: Normal Last colonoscopy: 12/17/2020. Results were: Normal, 10-year recall Last Dexa: 04/29/2022. Results were: Normal  Past medical history, past surgical history, family history and social history were all reviewed and documented in the EPIC chart. Married. Retired from school system, taught special ed. Works at [employer] full time, [employer] and [employer] part-time. 64 yo daughter, married, living in [place]. 64 yo son.   ROS:  A ROS was performed and pertinent positives and negatives are included.  Exam:  Vitals:   05/31/23 1133  BP: 118/74  Pulse: 62  SpO2: 100%  Weight: 150 lb 9.6 oz (68.3 kg)  Height: 5\' 7"  (1.702 m)   Body mass index is 23.59 kg/m.  General appearance:  Normal Thyroid:  Symmetrical, normal in size, without palpable masses or nodularity. Respiratory  Auscultation:  Clear without wheezing or rhonchi Cardiovascular  Auscultation:  Regular rate, without rubs, murmurs or gallops  Edema/varicosities:  Not grossly evident Abdominal  Soft,nontender, without masses, guarding or rebound.  Liver/spleen:  No organomegaly noted  Hernia:  None appreciated  Skin  Inspection:  Grossly normal Breasts: Examined lying and sitting.   Right: Without masses, retractions, nipple discharge or axillary adenopathy.   Left: Without masses, retractions, nipple discharge or axillary  adenopathy. Genitourinary   Inguinal/mons:  Normal without inguinal adenopathy  External genitalia:  Normal appearing vulva with no masses, tenderness, or lesions  BUS/Urethra/Skene's glands:  Normal  Vagina:  Normal appearing with normal color and discharge, no lesions  Cervix:  And uterus absent  Adnexa/parametria:     Rt: Normal in size, without masses or tenderness.   Lt: Normal in size, without masses or tenderness.  Anus and perineum: Normal  Digital rectal exam: Deferred  Patient informed chaperone available to be present for breast and pelvic exam. Patient has requested no chaperone to be present. Patient has been advised what will be completed during breast and pelvic exam.   Assessment/Plan:  57 y.o. G2P2002 for annual exam.   Well female exam with routine gynecological exam - Education provided on SBEs, importance of preventative screenings, current guidelines, high calcium diet, regular exercise, and multivitamin daily. Labs with PCP.   Postmenopausal hormone therapy - Plan: estradiol (VIVELLE-DOT) 0.05 MG/24HR patch twice weekly. Using as prescribed with good management. Aware of risk with continued use.   Screening for cervical cancer - Abnormal pap years ago, normal repeat. No longer screening per guidelines.   Screening for breast cancer - Normal mammogram history.  Continue annual screenings.  Normal breast exam today.  Screening for colon cancer - 2022 colonoscopy. Will repeat at 10-year interval per GI's recommendation.   Screening for osteoporosis - Normal bone density 03/2022.   Return in 1 year for annual or sooner if needed.     Olivia Mackie DNP, 12:01 PM 05/31/2023

## 2023-11-07 ENCOUNTER — Ambulatory Visit (INDEPENDENT_AMBULATORY_CARE_PROVIDER_SITE_OTHER): Payer: BC Managed Care – PPO | Admitting: Physician Assistant

## 2023-11-07 ENCOUNTER — Encounter: Payer: Self-pay | Admitting: Physician Assistant

## 2023-11-07 VITALS — BP 114/70 | HR 59 | Temp 97.2°F | Ht 67.32 in | Wt 148.0 lb

## 2023-11-07 DIAGNOSIS — Z0000 Encounter for general adult medical examination without abnormal findings: Secondary | ICD-10-CM | POA: Diagnosis not present

## 2023-11-07 LAB — COMPREHENSIVE METABOLIC PANEL
ALT: 15 U/L (ref 0–35)
AST: 25 U/L (ref 0–37)
Albumin: 4.3 g/dL (ref 3.5–5.2)
Alkaline Phosphatase: 38 U/L — ABNORMAL LOW (ref 39–117)
BUN: 12 mg/dL (ref 6–23)
CO2: 28 meq/L (ref 19–32)
Calcium: 9 mg/dL (ref 8.4–10.5)
Chloride: 103 meq/L (ref 96–112)
Creatinine, Ser: 0.6 mg/dL (ref 0.40–1.20)
GFR: 99.92 mL/min (ref >60.00)
Glucose, Bld: 83 mg/dL (ref 70–99)
Potassium: 4 meq/L (ref 3.5–5.1)
Sodium: 139 meq/L (ref 135–145)
Total Bilirubin: 0.4 mg/dL (ref 0.2–1.2)
Total Protein: 6.6 g/dL (ref 6.0–8.3)

## 2023-11-07 LAB — LIPID PANEL
Cholesterol: 153 mg/dL (ref 0–200)
HDL: 56.9 mg/dL (ref >39.00)
LDL Cholesterol: 81 mg/dL (ref 0–99)
NonHDL: 96.53
Total CHOL/HDL Ratio: 3
Triglycerides: 78 mg/dL (ref 0.0–149.0)
VLDL: 15.6 mg/dL (ref 0.0–40.0)

## 2023-11-07 LAB — CBC WITH DIFFERENTIAL/PLATELET
Basophils Absolute: 0 10*3/uL (ref 0.0–0.1)
Basophils Relative: 0.5 % (ref 0.0–3.0)
Eosinophils Absolute: 0.1 10*3/uL (ref 0.0–0.7)
Eosinophils Relative: 2.4 % (ref 0.0–5.0)
HCT: 40.1 % (ref 36.0–46.0)
Hemoglobin: 13.3 g/dL (ref 12.0–15.0)
Lymphocytes Relative: 20.4 % (ref 12.0–46.0)
Lymphs Abs: 1.3 10*3/uL (ref 0.7–4.0)
MCHC: 33.1 g/dL (ref 30.0–36.0)
MCV: 91.1 fL (ref 78.0–100.0)
Monocytes Absolute: 0.5 10*3/uL (ref 0.1–1.0)
Monocytes Relative: 8 % (ref 3.0–12.0)
Neutro Abs: 4.3 10*3/uL (ref 1.4–7.7)
Neutrophils Relative %: 68.7 % (ref 43.0–77.0)
Platelets: 189 10*3/uL (ref 150.0–400.0)
RBC: 4.4 Mil/uL (ref 3.87–5.11)
RDW: 14.2 % (ref 11.5–15.5)
WBC: 6.2 10*3/uL (ref 4.0–10.5)

## 2023-11-07 LAB — TSH: TSH: 1.98 u[IU]/mL (ref 0.35–5.50)

## 2023-11-07 NOTE — Progress Notes (Signed)
Patient ID: Natasha Howell, female    DOB: 1966/04/24, 57 y.o.   MRN: [id number]   Assessment & Plan:  Annual physical exam -     CBC with Differential/Platelet -     Comprehensive metabolic panel -     Lipid panel -     TSH   Age-appropriate screening and counseling performed today. Will check labs and call with results. Preventive measures discussed and printed in AVS for patient.   Patient Counseling: [x]   Nutrition: Stressed importance of moderation in sodium/caffeine intake, saturated fat and cholesterol, caloric balance, sufficient intake of fresh fruits, vegetables, and fiber.  [x]   Stressed the importance of regular exercise.   []   Substance Abuse: Discussed cessation/primary prevention of tobacco, alcohol, or other drug use; driving or other dangerous activities under the influence; availability of treatment for abuse.   []   Injury prevention: Discussed safety belts, safety helmets, smoke detector, smoking near bedding or upholstery.   []   Sexuality: Discussed sexually transmitted diseases, partner selection, use of condoms, avoidance of unintended pregnancy  and contraceptive alternatives.   [x]   Dental health: Discussed importance of regular tooth brushing, flossing, and dental visits.  [x]   Health maintenance and immunizations reviewed. Please refer to Health maintenance section.        Return in about 1 year (around 11/06/2024) for physical.    Subjective:    Chief Complaint  Patient presents with   Annual Exam    Pt in office for annual CPE and fasting labs; pt states she is not fasting and tends to always do non fasting labs; no concerns to discuss; Pt mentions she has had a few episodes of vomiting after eating but only happened a few times when over eating;     HPI Patient is in today for annual exam. Doing well, no changes in her health status since last visit.   Past Medical History:  Diagnosis Date   Allergy    Anxiety    GERD  (gastroesophageal reflux disease)    Heart murmur    Heard on occasion-is not under treatment   History of chicken pox    History of fainting spells of unknown cause    History of recurrent UTIs    Vitiligo     Past Surgical History:  Procedure Laterality Date   TOTAL VAGINAL HYSTERECTOMY  2001   with unilateral salpingo-oophorectomy   WISDOM TOOTH EXTRACTION      Family History  Problem Relation Age of Onset   Hypertension Mother    Arthritis Mother    Depression Mother    Hyperlipidemia Mother    Diabetes Sister    Mental illness Sister    Depression Brother    Anxiety disorder Brother    Breast cancer Maternal Aunt    Colon cancer Neg Hx    Colon polyps Neg Hx    Esophageal cancer Neg Hx    Rectal cancer Neg Hx    Stomach cancer Neg Hx     Social History   Tobacco Use   Smoking status: Never   Smokeless tobacco: Never  Vaping Use   Vaping status: Never Used  Substance Use Topics   Alcohol use: Yes    Alcohol/week: 7.0 standard drinks of alcohol    Types: 7 Glasses of wine per week   Drug use: No     Allergies  Allergen Reactions   Plant Derived Enzymes     Arugula ?    Morphine And Codeine Rash  Review of Systems NEGATIVE UNLESS OTHERWISE INDICATED IN HPI      Objective:     BP 114/70 (BP Location: Left Arm, Patient Position: Sitting, Cuff Size: Normal)   Pulse (!) 59   Temp (!) 97.2 F (36.2 C) (Temporal)   Ht 5' 7.32" (1.71 m)   Wt 148 lb (67.1 kg)   LMP  (LMP Unknown)   SpO2 98%   BMI 22.96 kg/m   Wt Readings from Last 3 Encounters:  11/07/23 148 lb (67.1 kg)  05/31/23 150 lb 9.6 oz (68.3 kg)  11/03/22 149 lb 3.2 oz (67.7 kg)    BP Readings from Last 3 Encounters:  11/07/23 114/70  05/31/23 118/74  11/03/22 100/63     Physical Exam Vitals and nursing note reviewed.  Constitutional:      Appearance: Normal appearance. She is normal weight. She is not toxic-appearing.  HENT:     Head: Normocephalic and atraumatic.      Right Ear: Tympanic membrane, ear canal and external ear normal.     Left Ear: Tympanic membrane, ear canal and external ear normal.     Nose: Nose normal.     Mouth/Throat:     Mouth: Mucous membranes are moist.  Eyes:     Extraocular Movements: Extraocular movements intact.     Conjunctiva/sclera: Conjunctivae normal.     Pupils: Pupils are equal, round, and reactive to light.  Cardiovascular:     Rate and Rhythm: Normal rate and regular rhythm.     Pulses: Normal pulses.     Heart sounds: Normal heart sounds.  Pulmonary:     Effort: Pulmonary effort is normal.     Breath sounds: Normal breath sounds.  Abdominal:     General: Abdomen is flat. Bowel sounds are normal.     Palpations: Abdomen is soft.  Musculoskeletal:        General: Normal range of motion.     Cervical back: Normal range of motion and neck supple.  Skin:    General: Skin is warm and dry.     Comments: Vitiligo   Neurological:     General: No focal deficit present.     Mental Status: She is alert and oriented to person, place, and time.  Psychiatric:        Mood and Affect: Mood normal.        Behavior: Behavior normal.        Thought Content: Thought content normal.        Judgment: Judgment normal.          Natasha Howell M Traniece Boffa, PA-C

## 2023-11-18 ENCOUNTER — Other Ambulatory Visit: Payer: Self-pay | Admitting: Physician Assistant

## 2023-11-18 DIAGNOSIS — Z1231 Encounter for screening mammogram for malignant neoplasm of breast: Secondary | ICD-10-CM

## 2023-12-25 ENCOUNTER — Other Ambulatory Visit: Payer: Self-pay | Admitting: Physician Assistant

## 2023-12-26 MED ORDER — CLONAZEPAM 0.5 MG PO TABS: ORAL_TABLET | ORAL | 0 refills | Status: AC

## 2023-12-26 NOTE — Telephone Encounter (Signed)
11/07/2023 LOV  03/23/2023 Fill Date  20/0 refills

## 2024-01-05 ENCOUNTER — Ambulatory Visit
Admission: RE | Admit: 2024-01-05 | Discharge: 2024-01-05 | Disposition: A | Discharge status: Home / Self Care | Payer: 59 | Source: Ambulatory Visit | Attending: Physician Assistant

## 2024-01-05 DIAGNOSIS — Z1231 Encounter for screening mammogram for malignant neoplasm of breast: Secondary | ICD-10-CM

## 2024-01-09 ENCOUNTER — Encounter: Payer: Self-pay | Admitting: Physician Assistant

## 2024-04-25 ENCOUNTER — Telehealth: Payer: Self-pay | Admitting: Obstetrics and Gynecology

## 2024-04-25 DIAGNOSIS — E2839 Other primary ovarian failure: Secondary | ICD-10-CM

## 2024-04-25 NOTE — Telephone Encounter (Signed)
 Referral to breast center has stopped scans New referral placed to [facility], but can change to another location if needed. Dr. Tia Flowers

## 2024-06-04 ENCOUNTER — Ambulatory Visit: Payer: BC Managed Care – PPO | Admitting: Obstetrics and Gynecology

## 2024-06-11 ENCOUNTER — Encounter: Payer: Self-pay | Admitting: Obstetrics and Gynecology

## 2024-06-11 ENCOUNTER — Ambulatory Visit (INDEPENDENT_AMBULATORY_CARE_PROVIDER_SITE_OTHER): Admitting: Obstetrics and Gynecology

## 2024-06-11 VITALS — BP 110/78 | HR 63 | Ht 66.54 in | Wt 150.0 lb

## 2024-06-11 DIAGNOSIS — Z01419 Encounter for gynecological examination (general) (routine) without abnormal findings: Secondary | ICD-10-CM

## 2024-06-11 DIAGNOSIS — Z79890 Hormone replacement therapy (postmenopausal): Secondary | ICD-10-CM

## 2024-06-11 DIAGNOSIS — N3281 Overactive bladder: Secondary | ICD-10-CM

## 2024-06-11 MED ORDER — ESTRADIOL 0.05 MG/24HR TD PTTW: 1.0000 | MEDICATED_PATCH | TRANSDERMAL | 12 refills | Status: DC

## 2024-06-11 MED ORDER — ESTRADIOL 0.05 MG/24HR TD PTTW: 1.0000 | MEDICATED_PATCH | TRANSDERMAL | 3 refills | Status: DC

## 2024-06-11 MED ORDER — INTRAROSA 6.5 MG VA INST: 1.0000 | VAGINAL_INSERT | Freq: Every evening | VAGINAL | 12 refills | Status: DC | PRN

## 2024-06-11 NOTE — Progress Notes (Signed)
 58 y.o. y.o. female here for annual exam. No LMP recorded (lmp unknown). Patient has had a hysterectomy.     [id number] presents for annual exam. Postmenopausal - on ERT. Started ERT 09/2019 for vasomotor symptoms with good management. S/P 2001 TAH and unilateral salpingo-oophorectomy in her mid 30's for a dermoid cyst. Sexually active, no dyspareunia. Abnormal pap years ago, repeat normal.   Gynecologic History No LMP recorded (lmp unknown). Patient has had a hysterectomy.   Contraception/Family planning: status post hysterectomy Sexually active: Yes  Health Maintenance Last Pap: 04/03/2015. Results were: Normal neg HPV Last mammogram: 01/05/2024. Results were: Normal Last colonoscopy: 12/17/2020. Results were: Normal, 10-year recall Last Dexa: 04/29/2022. Results were: Normal no fractures. Has lost more height this year. To repeat q2 years. Referral placed to [facility]. No family h/o fractures   8/4- AEX would like to talk about pelvic floor, having leakage  even when bladder isn't full.   l There is no height or weight on file to calculate BMI.   There were no vitals taken for this visit.  No results found for: DIAGPAP, HPVHIGH, ADEQPAP  GYN HISTORY: No results found for: DIAGPAP, HPVHIGH, ADEQPAP  OB History  Gravida Para Term Preterm AB Living  2 2 2   2   SAB IAB Ectopic Multiple Live Births      2    # Outcome Date GA Lbr Len/2nd Weight Sex Type Anes PTL Lv  2 Term      Vag-Spont   LIV  1 Term      Vag-Spont   LIV    Past Medical History:  Diagnosis Date   Allergy    Anxiety    GERD (gastroesophageal reflux disease)    Heart murmur    Heard on occasion-is not under treatment   History of chicken pox    History of fainting spells of unknown cause    History of recurrent UTIs    Vitiligo     Past Surgical History:  Procedure Laterality Date   TOTAL VAGINAL HYSTERECTOMY  2001   with unilateral salpingo-oophorectomy   WISDOM TOOTH EXTRACTION       Current Outpatient Medications on File Prior to Visit  Medication Sig Dispense Refill   clonazePAM  (KLONOPIN ) 0.5 MG tablet TAKE 1/2 TABLET AS NEEDED FOR TRAVEL 20 tablet 0   estradiol  (VIVELLE -DOT) 0.05 MG/24HR patch Place 1 patch (0.05 mg total) onto the skin 2 (two) times a week. 24 patch 3   Ibuprofen (ADVIL PO) Take by mouth.     Lactobacillus (PROBIOTIC ACIDOPHILUS PO) Take by mouth. (Patient not taking: Reported on 11/07/2023)     magnesium gluconate (MAGONATE) 500 MG tablet Take 100 mg by mouth daily.     Turmeric (QC TUMERIC COMPLEX PO) Take by mouth. (Patient not taking: Reported on 11/07/2023)     No current facility-administered medications on file prior to visit.    Social History   Socioeconomic History   Marital status: Married    Spouse name: Not on file   Number of children: Not on file   Years of education: Not on file   Highest education level: Not on file  Occupational History   Not on file  Tobacco Use   Smoking status: Never   Smokeless tobacco: Never  Vaping Use   Vaping status: Never Used  Substance and Sexual Activity   Alcohol use: Yes    Alcohol/week: 7.0 standard drinks of alcohol    Types: 7 Glasses of wine per week  Drug use: No   Sexual activity: Yes    Birth control/protection: Surgical    Comment: hysterectomy  Other Topics Concern   Not on file  Social History Narrative   Not on file   Social Drivers of Health   Financial Resource Strain: Not on file  Food Insecurity: Not on file  Transportation Needs: Not on file  Physical Activity: Not on file  Stress: Not on file  Social Connections: Unknown (03/23/2022)   Received from [facility]   Social Network    Social Network: Not on file  Intimate Partner Violence: Unknown (02/12/2022)   Received from [facility]   HITS    Physically Hurt: Not on file    Insult or Talk Down To: Not on file    Threaten Physical Harm: Not on file    Scream or Curse: Not on file    Family  History  Problem Relation Age of Onset   Hypertension Mother    Arthritis Mother    Depression Mother    Hyperlipidemia Mother    Diabetes Sister    Mental illness Sister    Depression Brother    Anxiety disorder Brother    Breast cancer Maternal Aunt    Colon cancer Neg Hx    Colon polyps Neg Hx    Esophageal cancer Neg Hx    Rectal cancer Neg Hx    Stomach cancer Neg Hx      Allergies  Allergen Reactions   Plant Derived Enzymes     Arugula ?    Morphine And Codeine Rash      Patient's last menstrual period was No LMP recorded (lmp unknown). Patient has had a hysterectomy..           Review of Systems Alls systems reviewed and are negative.     Physical Exam Constitutional:      Appearance: Normal appearance.  Genitourinary:     Vulva and urethral meatus normal.     No lesions in the vagina.     Right Labia: No rash, lesions or skin changes.    Left Labia: No lesions, skin changes or rash.    No vaginal discharge or tenderness.     No vaginal prolapse present.    No vaginal atrophy present.     Right Adnexa: not tender, not palpable and no mass present.    Left Adnexa: not tender, not palpable and no mass present.    No cervical motion tenderness or discharge.     Uterus is not enlarged, tender or irregular.  Breasts:    Right: Normal.     Left: Normal.  HENT:     Head: Normocephalic.  Neck:     Thyroid: No thyroid mass, thyromegaly or thyroid tenderness.  Cardiovascular:     Rate and Rhythm: Normal rate and regular rhythm.     Heart sounds: Normal heart sounds, S1 normal and S2 normal.  Pulmonary:     Effort: Pulmonary effort is normal.     Breath sounds: Normal breath sounds and air entry.  Abdominal:     General: There is no distension.     Palpations: Abdomen is soft. There is no mass.     Tenderness: There is no abdominal tenderness. There is no guarding or rebound.  Musculoskeletal:        General: Normal range of motion.     Cervical back:  Full passive range of motion without pain, normal range of motion and neck supple. No tenderness.  Right lower leg: No edema.     Left lower leg: No edema.  Neurological:     Mental Status: She is alert.  Skin:    General: Skin is warm.  Psychiatric:        Mood and Affect: Mood normal.        Behavior: Behavior normal.        Thought Content: Thought content normal.  Vitals and nursing note reviewed. Exam conducted with a chaperone present.       A:         Well Woman GYN exam OAB Early menopause On estrogen patch Dyspareunia                             P:        Pap smear not indicated Encouraged annual mammogram screening Colon cancer screening up-to-date DXA ordered today Labs and immunizations to do with PMD Encouraged healthy lifestyle practices Encouraged Vit D and Calcium  To try intrarosa  at bedtime for OAB and dyspareunia. Brochure given and counseled on the medication. Refill sent on the vivelle  patch as well Referral placed to urogyn for evaluation of OAB. Offered PT. She is doing kegels  No follow-ups on file.  Natasha Howell

## 2024-06-11 NOTE — Patient Instructions (Signed)
 Locations you can schedule your bone scan are:  The [facility] bone scan location scheduling line is [phone]  [facility] is [phone]  [facility] radiology [phone]   [facility] [phone]  [facility] Zelda Salmon: [phone]    Another option if they are behind is [facility] and their number is [phone].  I can send the referral if you want to go there.    Please let me know if you have any trouble scheduling. This is important for management of osteoporosis or osteopenia.   I can sit down with you after the scan to discuss results and treatment.  Dr. Glennon COFFEE REFERRAL: Dr. Bernardine Gillis and Dr. Marilynne at [facility] with [facility] office: [address], [phone]  The referral was placed but it may take a couple of weeks for the visit to be coordinated.

## 2024-07-02 ENCOUNTER — Other Ambulatory Visit: Payer: Self-pay | Admitting: Obstetrics and Gynecology

## 2024-07-02 DIAGNOSIS — N3281 Overactive bladder: Secondary | ICD-10-CM

## 2024-07-02 NOTE — Telephone Encounter (Signed)
.  Med refill request: estradiol   Last AEX:06/11/24  Next AEX:06/13/25 Last MMG (if hormonal med)01/05/24-BI-RADS CATEGORY 1: Negative.  Refill authorized: Please Advise?

## 2024-07-05 ENCOUNTER — Encounter: Payer: Self-pay | Admitting: Obstetrics and Gynecology

## 2024-09-26 ENCOUNTER — Other Ambulatory Visit (HOSPITAL_COMMUNITY): Admission: RE | Admit: 2024-09-26 | Source: Other Acute Inpatient Hospital

## 2024-09-26 ENCOUNTER — Other Ambulatory Visit (HOSPITAL_COMMUNITY)
Admission: RE | Admit: 2024-09-26 | Discharge: 2024-09-26 | Disposition: A | Attending: Obstetrics and Gynecology | Admitting: Obstetrics and Gynecology

## 2024-09-26 ENCOUNTER — Ambulatory Visit: Admitting: Obstetrics and Gynecology

## 2024-09-26 ENCOUNTER — Encounter: Payer: Self-pay | Admitting: Obstetrics and Gynecology

## 2024-09-26 VITALS — BP 118/71 | HR 65 | Ht 66.5 in | Wt 150.0 lb

## 2024-09-26 DIAGNOSIS — R3915 Urgency of urination: Secondary | ICD-10-CM

## 2024-09-26 DIAGNOSIS — N3281 Overactive bladder: Secondary | ICD-10-CM

## 2024-09-26 DIAGNOSIS — N393 Stress incontinence (female) (male): Secondary | ICD-10-CM | POA: Diagnosis not present

## 2024-09-26 DIAGNOSIS — R8290 Unspecified abnormal findings in urine: Secondary | ICD-10-CM | POA: Insufficient documentation

## 2024-09-26 DIAGNOSIS — N8110 Cystocele, unspecified: Secondary | ICD-10-CM

## 2024-09-26 LAB — POC URINALSYSI DIPSTICK (AUTOMATED)
Bilirubin, UA: NEGATIVE
Glucose, UA: NEGATIVE
Ketones, UA: NEGATIVE
Leukocytes, UA: NEGATIVE
Nitrite, UA: NEGATIVE
Protein, UA: NEGATIVE
Spec Grav, UA: 1.015 (ref 1.010–1.025)
Urobilinogen, UA: 0.2 U/dL
pH, UA: 6.5 (ref 5.0–8.0)

## 2024-09-26 LAB — URINALYSIS, COMPLETE (UACMP) WITH MICROSCOPIC
Bacteria, UA: NONE SEEN
Bilirubin Urine: NEGATIVE
Glucose, UA: NEGATIVE mg/dL
Ketones, ur: NEGATIVE mg/dL
Leukocytes,Ua: NEGATIVE
Nitrite: NEGATIVE
Protein, ur: NEGATIVE mg/dL
Specific Gravity, Urine: 1.009 (ref 1.005–1.030)
pH: 6 (ref 5.0–8.0)

## 2024-09-26 NOTE — Progress Notes (Unsigned)
 [facility] New Patient Evaluation and Consultation  Referring Provider: Glennon Almarie POUR, MD PCP: Kathrene Mardy HERO, PA-C Date of Service: 09/26/2024  SUBJECTIVE Chief Complaint: Establish Care  History of Present Illness: Natasha Howell is a 58 y.o. White or Caucasian female seen in consultation at the request of Dr. Glennon for evaluation of OAB.    Review of records significant for: Hx of Hyst  Urinary Symptoms: Leaks urine with cough/ sneeze, laughing, exercise, during sex, and with a full bladder Leaks 1-3 time(s) per days.  Pad use: Knix panties Patient is bothered by UI symptoms.  Day time voids 10.  Nocturia: 0-1 times per night to void. Voiding dysfunction:  empties bladder well.  Patient does not use a catheter to empty bladder.  When urinating, patient feels dribbling after finishing and to push on her belly or vagina to empty bladder Drinks: 80-100oz water per day and 16oz coffee per day  UTIs: 0 UTI's in the last year.   Denies history of urologic concerns No results found for the last 90 days.   Pelvic Organ Prolapse Symptoms:                  Patient Denies a feeling of a bulge the vaginal area.   Bowel Symptom: Bowel movements: 1 time(s) per day Stool consistency: soft  Straining: no.  Splinting: no.  Incomplete evacuation: no.  Patient Denies accidental bowel leakage / fecal incontinence Bowel regimen: diet and fiber Last colonoscopy: Date 2022, Results Normal HM Colonoscopy          Upcoming     Colonoscopy (Every 10 Years) Next due on 12/17/2030    12/17/2020  COLONOSCOPY   Only the first 1 history entries have been loaded, but more history exists.                Sexual Function Sexually active: yes.  Sexual orientation: Straight Pain with sex: No  Pelvic Pain Denies pelvic pain    Past Medical History:  Past Medical History:  Diagnosis Date   Allergy    Anxiety    GERD (gastroesophageal reflux  disease)    Heart murmur    Heard on occasion-is not under treatment   History of chicken pox    History of fainting spells of unknown cause    History of recurrent UTIs    Vitiligo      Past Surgical History:   Past Surgical History:  Procedure Laterality Date   TOTAL VAGINAL HYSTERECTOMY  2001   with unilateral salpingo-oophorectomy   WISDOM TOOTH EXTRACTION       Past OB/GYN History: [id number] Vaginal deliveries: 2,  Forceps/ Vacuum deliveries: 0, Cesarean section: 0 Menopausal: Yes, at age 38 Contraception: Hyst. Last pap smear was July 2025.  Any history of abnormal pap smears: no. HM PAP   This patient has no relevant Health Maintenance data.     Medications: Patient has a current medication list which includes the following prescription(s): clonazepam , estradiol , ibuprofen, and magnesium gluconate.   Allergies: Patient is allergic to plant derived enzymes and morphine and codeine.   Social History:  Social History   Tobacco Use   Smoking status: Never   Smokeless tobacco: Never  Vaping Use   Vaping status: Never Used  Substance Use Topics   Alcohol use: Yes    Alcohol/week: 7.0 standard drinks of alcohol    Types: 7 Glasses of wine per week   Drug use: No    Relationship status:  married Patient lives with Husband.   Patient is employed Professor and dentist at [employer] . Regular exercise: Yes: Swim and walks History of abuse: No  Family History:   Family History  Problem Relation Age of Onset   Hypertension Mother    Arthritis Mother    Depression Mother    Hyperlipidemia Mother    Diabetes Sister    Mental illness Sister    Depression Brother    Anxiety disorder Brother    Breast cancer Maternal Aunt    Colon cancer Neg Hx    Colon polyps Neg Hx    Esophageal cancer Neg Hx    Rectal cancer Neg Hx    Stomach cancer Neg Hx      Review of Systems: Review of Systems  Constitutional:  Positive for malaise/fatigue. Negative  for chills and fever.       +Weight Gain  Respiratory:  Negative for cough and shortness of breath.   Cardiovascular:  Negative for chest pain and palpitations.  Gastrointestinal:  Negative for abdominal pain, blood in stool, constipation and diarrhea.  Skin:  Negative for rash.  Neurological:  Negative for weakness.  Endo/Heme/Allergies:  Bruises/bleeds easily.       +Hot Flash  Psychiatric/Behavioral:  Negative for depression and suicidal ideas.      OBJECTIVE Physical Exam: Vitals:   09/26/24 1417  BP: 118/71  Pulse: 65  Weight: 150 lb (68 kg)  Height: 5' 6.5 (1.689 m)    Physical Exam Vitals reviewed. Exam conducted with a chaperone present.  Constitutional:      Appearance: Normal appearance.  Pulmonary:     Effort: Pulmonary effort is normal.  Abdominal:     Palpations: Abdomen is soft.  Neurological:     General: No focal deficit present.     Mental Status: She is alert and oriented to person, place, and time.  Psychiatric:        Mood and Affect: Mood normal.        Behavior: Behavior normal. Behavior is cooperative.        Thought Content: Thought content normal.      GU / Detailed Urogynecologic Evaluation:  Pelvic Exam: Normal external female genitalia; Bartholin's and Skene's glands normal in appearance; urethral meatus normal in appearance, no urethral masses or discharge.   CST: positive  s/p hysterectomy: Speculum exam reveals normal vaginal mucosa with  atrophy and normal vaginal cuff.  Adnexa normal adnexa.    With apex supported, anterior compartment defect was reduced  Pelvic floor strength III/V  Pelvic floor musculature: Right levator non-tender, Right obturator non-tender, Left levator non-tender, Left obturator non-tender  POP-Q:   POP-Q  -1                                            Aa   -1                                           Ba  -6                                              C  3                                             Gh  3                                            Pb  9                                            tvl   -2.5                                            Ap  -2.5                                            Bp                                                 D      Rectal Exam:  Normal external exam  Post-Void Residual (PVR) by Bladder Scan: In order to evaluate bladder emptying, we discussed obtaining a postvoid residual and patient agreed to this procedure.  Procedure: The ultrasound unit was placed on the patient's abdomen in the suprapubic region after the patient had voided.    Post Void Residual - 09/26/24 1430       Post Void Residual   Post Void Residual 0 mL           Laboratory Results: Lab Results  Component Value Date   COLORU yellow 09/26/2024   CLARITYU clear 09/26/2024   GLUCOSEUR Negative 09/26/2024   BILIRUBINUR NEGATIVE 09/26/2024   KETONESU neg 09/26/2024   SPECGRAV 1.015 09/26/2024   RBCUR small 09/26/2024   PHUR 6.5 09/26/2024   PROTEINUR NEGATIVE 09/26/2024   UROBILINOGEN 0.2 09/26/2024   LEUKOCYTESUR NEGATIVE 09/26/2024    Lab Results  Component Value Date   CREATININE 0.60 11/07/2023   CREATININE 0.53 03/10/2022   CREATININE 0.52 09/26/2020    No results found for: HGBA1C  Lab Results  Component Value Date   HGB 13.3 11/07/2023     ASSESSMENT AND PLAN Ms. Loyer is a 58 y.o. with:  1. SUI (stress urinary incontinence, female)   2. Prolapse of anterior vaginal wall   3. Urinary urgency   4. Abnormal urine    Patient's main issue is leaking with stress. She has a very active job and reports sometimes if her bladder gets full even the act of walking will cause squirts of urine without urgency. She had her urethra stretched over 20 years ago and questions is this could be one of the causes of her SUI. To treat the SUI we discussed options of pessary, urethral bulking and surgical sling. Patient thinks she is most interested  in urethral bulking. Will  send a message to get this scheduled, we discussed we are booking into next year.  Not symptomatic. If patient were to be more bothered by prolapse we could do a pessary or plan for surgical repair. She could also consider a surgical sling at that time if needed. For now will expectantly manage.  Not as bothersome to patient. Reports most her symptoms are related to SUI. We did discuss bladder irritation diet and ways to prevent concentrated urine.  Urine positive for blood. Will send for micro and culture to rule out UTI.   Patient to return for urethral bulking or sooner if needed.     Tawna Alwin G Jaclynne Baldo, NP

## 2024-09-26 NOTE — Patient Instructions (Signed)
 Today we talked about ways to manage bladder urgency such as altering your diet to avoid irritative beverages and foods (bladder diet) as well as attempting to decrease stress and other exacerbating factors.  You can also chew a plain Tums 1-3 times per day to make your urine less acidic, especially if you have eating/drinking acidic things.   There is a website with helpful information for people with bladder irritation, called the IC Network at https://www.ic-network.com. This website has more information about a healthy bladder diet and patient forums for support.  The Most Bothersome Foods* The Least Bothersome Foods*  Coffee - Regular & Decaf Tea - caffeinated Carbonated beverages - cola, non-colas, diet & caffeine-free Alcohols - Beer, Red Wine, White Wine, [address] - Grapefruit, [place], Orange, [employer] - Cranberry, Grapefruit, Orange, Pineapple Vegetables - Tomato & Tomato Products Flavor Enhancers - Hot peppers, Spicy foods, Chili, Horseradish, Vinegar, Monosodium glutamate (MSG) Artificial Sweeteners - NutraSweet, Sweet 'N Low, Equal (sweetener), Saccharin Ethnic foods - Mexican, Thai, Indian food [employer] - low-fat & whole Fruits - Bananas, Blueberries, Honeydew melon, Pears, Raisins, Watermelon Vegetables - Broccoli, [address] Sprouts, [place], Carrots, Cauliflower, [place], Cucumber, Mushrooms, Peas, Radishes, Squash, Zucchini, White potatoes, Sweet potatoes & yams Poultry - Chicken, Eggs, Turkey, [employer] - Beef, Diplomatic Services Operational Officer, Lamb Seafood - Shrimp, [place] fish, Salmon Grains - Oat, Rice Snacks - Pretzels, Popcorn  *Mitch ALF et al. Diet and its role in interstitial cystitis/bladder pain syndrome (IC/BPS) and comorbid conditions. [employer]. BJU Int. 2012 Jan 11.     Consider options for stress incontinence. We will call you for urethral bulking.

## 2024-09-27 ENCOUNTER — Encounter: Payer: Self-pay | Admitting: Obstetrics and Gynecology

## 2024-09-28 ENCOUNTER — Ambulatory Visit: Payer: Self-pay | Admitting: Obstetrics and Gynecology

## 2024-09-28 LAB — URINE CULTURE: Culture: NO GROWTH

## 2024-11-05 ENCOUNTER — Encounter: Payer: Self-pay | Admitting: Obstetrics and Gynecology

## 2024-11-07 ENCOUNTER — Encounter: Payer: BC Managed Care – PPO | Admitting: Physician Assistant

## 2024-11-19 ENCOUNTER — Encounter: Payer: Self-pay | Admitting: *Deleted

## 2024-11-21 ENCOUNTER — Other Ambulatory Visit: Payer: Self-pay | Admitting: Physician Assistant

## 2024-11-21 DIAGNOSIS — Z1231 Encounter for screening mammogram for malignant neoplasm of breast: Secondary | ICD-10-CM

## 2024-11-21 NOTE — Telephone Encounter (Signed)
 Left vm 1/14 for pt to call to schedule

## 2024-12-28 ENCOUNTER — Encounter: Admitting: Physician Assistant

## 2025-01-08 ENCOUNTER — Ambulatory Visit

## 2025-01-29 ENCOUNTER — Other Ambulatory Visit (HOSPITAL_BASED_OUTPATIENT_CLINIC_OR_DEPARTMENT_OTHER)

## 2025-01-31 ENCOUNTER — Ambulatory Visit: Admitting: Obstetrics

## 2025-03-01 ENCOUNTER — Ambulatory Visit: Admitting: Obstetrics

## 2025-06-13 ENCOUNTER — Ambulatory Visit: Admitting: Obstetrics and Gynecology
